# Patient Record
Sex: Female | Born: 1955 | Race: White | Hispanic: No | Marital: Married | Smoking: Never smoker
Health system: Southern US, Community
[De-identification: ages and names within clinical notes are randomized; demographics above are authoritative.]

## PROBLEM LIST (undated history)

## (undated) DIAGNOSIS — I10 Essential (primary) hypertension: Secondary | ICD-10-CM

## (undated) DIAGNOSIS — J45909 Unspecified asthma, uncomplicated: Secondary | ICD-10-CM

---

## 2015-08-05 DIAGNOSIS — G43909 Migraine, unspecified, not intractable, without status migrainosus: Secondary | ICD-10-CM | POA: Insufficient documentation

## 2015-08-05 DIAGNOSIS — E2839 Other primary ovarian failure: Secondary | ICD-10-CM | POA: Insufficient documentation

## 2015-08-05 DIAGNOSIS — L859 Epidermal thickening, unspecified: Secondary | ICD-10-CM | POA: Insufficient documentation

## 2015-08-05 DIAGNOSIS — J45909 Unspecified asthma, uncomplicated: Secondary | ICD-10-CM | POA: Insufficient documentation

## 2015-08-05 DIAGNOSIS — I10 Essential (primary) hypertension: Secondary | ICD-10-CM | POA: Insufficient documentation

## 2015-08-05 DIAGNOSIS — G4700 Insomnia, unspecified: Secondary | ICD-10-CM | POA: Insufficient documentation

## 2015-10-05 DIAGNOSIS — E559 Vitamin D deficiency, unspecified: Secondary | ICD-10-CM | POA: Insufficient documentation

## 2015-10-05 DIAGNOSIS — R7989 Other specified abnormal findings of blood chemistry: Secondary | ICD-10-CM | POA: Insufficient documentation

## 2015-10-05 DIAGNOSIS — F4323 Adjustment disorder with mixed anxiety and depressed mood: Secondary | ICD-10-CM | POA: Insufficient documentation

## 2015-11-08 DIAGNOSIS — R079 Chest pain, unspecified: Secondary | ICD-10-CM | POA: Insufficient documentation

## 2015-11-08 DIAGNOSIS — E785 Hyperlipidemia, unspecified: Secondary | ICD-10-CM | POA: Insufficient documentation

## 2015-12-25 ENCOUNTER — Emergency Department
Admission: EM | Admit: 2015-12-25 | Discharge: 2015-12-25 | Disposition: A | Discharge status: Home / Self Care | Payer: 59 | Source: Home / Self Care | Attending: Family Medicine | Admitting: Family Medicine

## 2015-12-25 DIAGNOSIS — R350 Frequency of micturition: Secondary | ICD-10-CM

## 2015-12-25 DIAGNOSIS — M545 Low back pain, unspecified: Secondary | ICD-10-CM

## 2015-12-25 DIAGNOSIS — R300 Dysuria: Secondary | ICD-10-CM | POA: Diagnosis not present

## 2015-12-25 DIAGNOSIS — R102 Pelvic and perineal pain: Secondary | ICD-10-CM

## 2015-12-25 HISTORY — DX: Essential (primary) hypertension: I10

## 2015-12-25 HISTORY — DX: Unspecified asthma, uncomplicated: J45.909

## 2015-12-25 LAB — POCT URINALYSIS DIP (MANUAL ENTRY)
Bilirubin, UA: NEGATIVE
Blood, UA: NEGATIVE
Glucose, UA: NEGATIVE
Ketones, POC UA: NEGATIVE
Leukocytes, UA: NEGATIVE
Nitrite, UA: NEGATIVE
Protein Ur, POC: NEGATIVE
Spec Grav, UA: >=1.03 (ref 1.005–1.03)
Urobilinogen, UA: 0.2 (ref 0–1)
pH, UA: 5 (ref 5–8)

## 2015-12-25 NOTE — ED Provider Notes (Signed)
CSN: [id number]     Arrival date & time 12/25/15  1145 History   First MD Initiated Contact with Patient 12/25/15 1214     Chief Complaint  Patient presents with  . Urinary Frequency  . Back Pain   (Consider location/radiation/quality/duration/timing/severity/associated sxs/prior Treatment) HPI The pt is a 60yo female presenting to [facility] with c/o bilateral lower back pain, lower abdominal pain, dysuria, and urinary frequency since yesterday.  Pt reports having 2 UTIs within the last 1.5 years and symptoms feel similar.  She typically takes cranberry tablets but thinks her husband accidentally got Azo pills this time. Denies fever, chills, n/v/d. Denies vaginal symptoms.   Past Medical History  Diagnosis Date  . Asthma   . Hypertension    History reviewed. No pertinent past surgical history. Family History  Problem Relation Age of Onset  . Heart failure Mother   . Heart failure Father   . Cancer Father   . Heart failure Brother    Social History  Substance Use Topics  . Smoking status: Never Smoker   . Smokeless tobacco: Never Used  . Alcohol Use: No   OB History    No data available     Review of Systems  Constitutional: Negative for fever and chills.  Gastrointestinal: Positive for abdominal pain (lower abdomen). Negative for nausea, vomiting and diarrhea.  Genitourinary: Positive for dysuria, urgency and frequency. Negative for hematuria, flank pain, decreased urine volume, vaginal bleeding, vaginal discharge, vaginal pain and pelvic pain.  Musculoskeletal: Positive for back pain ( lower back). Negative for myalgias.    Allergies  Review of patient's allergies indicates no known allergies.  Home Medications   Prior to Admission medications   Medication Sig Start Date End Date Taking? Authorizing Provider  hydrochlorothiazide (HYDRODIURIL) 12.5 MG tablet Take 12.5 mg by mouth daily.   Yes Historical Provider, MD  montelukast (SINGULAIR) 10 MG tablet Take 10 mg by mouth  at bedtime.   Yes Historical Provider, MD   Meds Ordered and Administered this Visit  Medications - No data to display  BP 121/80 mmHg  Pulse 68  Temp(Src) 98 F (36.7 C) (Oral)  Ht  (1.651 m)  Wt 198 lb 4 oz (89.926 kg)  BMI 32.99 kg/m2  SpO2 98%  LMP  No data found.   Physical Exam  Constitutional: She is oriented to person, place, and time. She appears well-developed and well-nourished. No distress.  HENT:  Head: Normocephalic and atraumatic.  Mouth/Throat: Oropharynx is clear and moist.  Eyes: Conjunctivae are normal. No scleral icterus.  Neck: Normal range of motion. Neck supple.  Cardiovascular: Normal rate, regular rhythm and normal heart sounds.   Pulmonary/Chest: Effort normal and breath sounds normal. No respiratory distress. She has no wheezes. She has no rales. She exhibits no tenderness.  Abdominal: Soft. She exhibits no distension. There is no tenderness. There is no CVA tenderness.  Musculoskeletal: Normal range of motion.  Neurological: She is alert and oriented to person, place, and time.  Skin: Skin is warm and dry. She is not diaphoretic.  Nursing note and vitals reviewed.   ED Course  Procedures (including critical care time)  Labs Review Labs Reviewed  URINE CULTURE  POCT URINALYSIS DIP (MANUAL ENTRY)    Imaging Review No results found.    MDM   1. Urinary frequency   2. Dysuria   3. Bilateral low back pain without sciatica   4. Pelvic pain in female     Pt c/o urinary symptoms,  lower abdominal cramping and lower back pain. Pt is afebrile. Normal exam.  UA: not c/w UTI, however, due to reports of using possible Azo, will send culture.  Encouraged pt to stay well hydrated. May have acetaminophen and ibuprofen for pain.  F/u with PCP in 4-5 days if not improving, sooner if worsening. She should be notified in 2-3 days of urine culture results, antibiotic will be called in if indicated by culture results. Patient verbalized  understanding and agreement with treatment plan.     Junius Finnerrin O'Malley, PA-C 12/25/15 1311

## 2015-12-25 NOTE — Discharge Instructions (Signed)
Be sure to stay well hydrated with clear liquids and/or Cranberry juice.  You may continue to take Azo to help with bladder spasms/urinary discomfort.  You may take acetaminophen 500mg  every 4-6 hours with a starting dose of 1000mg  and/or ibuprofen 600mg  every 6-8 hours for pain.  Your urine has been sent off to lab for culturing.  This should take 2-3 days for results to come back. If you do not hear back from our office by Tuesday afternoon, please [facility] for an update. We are open Monday-Friday 8am-8pm.

## 2015-12-25 NOTE — ED Notes (Signed)
Lower back pain, urinary frequency x 1 day

## 2015-12-26 LAB — URINE CULTURE
Colony Count: NO GROWTH
Organism ID, Bacteria: NO GROWTH

## 2015-12-27 ENCOUNTER — Telehealth: Payer: Self-pay | Admitting: *Deleted

## 2016-10-07 ENCOUNTER — Emergency Department
Admission: EM | Admit: 2016-10-07 | Discharge: 2016-10-07 | Disposition: A | Discharge status: Home / Self Care | Payer: 59 | Source: Home / Self Care | Attending: Family Medicine | Admitting: Family Medicine

## 2016-10-07 ENCOUNTER — Encounter: Payer: Self-pay | Admitting: Emergency Medicine

## 2016-10-07 DIAGNOSIS — R69 Illness, unspecified: Secondary | ICD-10-CM

## 2016-10-07 DIAGNOSIS — J111 Influenza due to unidentified influenza virus with other respiratory manifestations: Secondary | ICD-10-CM

## 2016-10-07 LAB — POCT URINALYSIS DIP (MANUAL ENTRY)
BILIRUBIN UA: NEGATIVE
Bilirubin, UA: NEGATIVE
Glucose, UA: NEGATIVE
LEUKOCYTES UA: NEGATIVE
Nitrite, UA: NEGATIVE
PROTEIN UA: NEGATIVE
Spec Grav, UA: 1.02 (ref 1.005–1.03)
UROBILINOGEN UA: 0.2 (ref 0–1)
pH, UA: 5.5 (ref 5–8)

## 2016-10-07 MED ORDER — PREDNISONE 20 MG PO TABS: ORAL_TABLET | ORAL | 0 refills | Status: DC

## 2016-10-07 MED ORDER — OSELTAMIVIR PHOSPHATE 75 MG PO CAPS: 75.0000 mg | ORAL_CAPSULE | Freq: Two times a day (BID) | ORAL | 0 refills | Status: DC

## 2016-10-07 MED ORDER — GUAIFENESIN-CODEINE 100-10 MG/5ML PO SOLN: ORAL | 0 refills | Status: DC

## 2016-10-07 NOTE — ED Provider Notes (Addendum)
[facility]    CSN: [id number] Arrival date & time: 10/07/16  1342     History   Chief Complaint Chief Complaint  Patient presents with  . Cough  . Nasal Congestion  . Generalized Body Aches  . Dysuria    HPI Haley Walton is a 61 y.o. female.   Complains of 2 day history flu-like illness including myalgias, headache, chills, fatigue, and cough.  Also has mild nasal congestion and sore throat.  Cough is non-productive and somewhat worse at night.  No pleuritic pain or shortness of breath.  She has had increased low back ache and is concerned that she may have a UTI, although she has no urinary symptoms. She has a history of asthma controlled on Symbicort, Singulair, and albuterol rescue inhaler.  She had pneumonia about 5 years ago.   The history is provided by the patient.    Past Medical History:  Diagnosis Date  . Asthma   . Hypertension     There are no active problems to display for this patient.   History reviewed. No pertinent surgical history.  OB History    No data available       Home Medications    Prior to Admission medications   Medication Sig Start Date End Date Taking? Authorizing Provider  ALPRAZolam Prudy Feeler) 0.25 MG tablet Take 0.25 mg by mouth at bedtime as needed for anxiety.   Yes Historical Provider, MD  guaiFENesin-codeine 100-10 MG/5ML syrup Take 10mL by mouth at bedtime as needed for cough 10/07/16   Lattie Haw, MD  hydrochlorothiazide (HYDRODIURIL) 12.5 MG tablet Take 12.5 mg by mouth daily.    Historical Provider, MD  montelukast (SINGULAIR) 10 MG tablet Take 10 mg by mouth at bedtime.    Historical Provider, MD  oseltamivir (TAMIFLU) 75 MG capsule Take 1 capsule (75 mg total) by mouth every 12 (twelve) hours. 10/07/16   Lattie Haw, MD  predniSONE (DELTASONE) 20 MG tablet Take one tab by mouth twice daily for 5 days, then one daily. Take with food. 10/07/16   Lattie Haw, MD    Family History Family History    Problem Relation Age of Onset  . Heart failure Mother   . Heart failure Father   . Cancer Father   . Heart failure Brother     Social History Social History  Substance Use Topics  . Smoking status: Never Smoker  . Smokeless tobacco: Never Used  . Alcohol use No     Allergies   Patient has no known allergies.   Review of Systems Review of Systems + sore throat + cough No pleuritic pain No wheezing + nasal congestion + post-nasal drainage No sinus pain/pressure No itchy/red eyes No earache No hemoptysis No SOB No fever, + chills No nausea No vomiting No abdominal pain No diarrhea No urinary symptoms No skin rash + fatigue + myalgias + headache Used OTC meds without relief   Physical Exam Triage Vital Signs ED Triage Vitals  Enc Vitals Group     BP 10/07/16 1451 143/74     Pulse Rate 10/07/16 1451 77     Resp 10/07/16 1451 16     Temp 10/07/16 1451 98.2 F (36.8 C)     Temp Source 10/07/16 1451 Oral     SpO2 --      Weight 10/07/16 1454 195 lb (88.5 kg)     Height 10/07/16 1454 5\' 5"  (1.651 m)     Head Circumference --  Peak Flow --      Pain Score 10/07/16 1456 2     Pain Loc --      Pain Edu? --      Excl. in GC? --    No data found.   Updated Vital Signs BP 143/74 (BP Location: Left Arm)   Pulse 77   Temp 98.2 F (36.8 C) (Oral)   Resp 16   Ht 5\' 5"  (1.651 m)   Wt 195 lb (88.5 kg)   BMI 32.45 kg/m   Visual Acuity Right Eye Distance:   Left Eye Distance:   Bilateral Distance:    Right Eye Near:   Left Eye Near:    Bilateral Near:     Physical Exam Nursing notes and Vital Signs reviewed. Appearance:  Patient appears stated age, and in no acute distress Eyes:  Pupils are equal, round, and reactive to light and accomodation.  Extraocular movement is intact.  Conjunctivae are not inflamed  Ears:  Canals normal.  Tympanic membranes normal.  Nose:  Mildly congested turbinates.  No sinus tenderness.   Pharynx:  Normal Neck:   Supple.  Tender enlarged posterior/lateral nodes are palpated bilaterally  Lungs:  Clear to auscultation.  Breath sounds are equal.  Moving air well. Heart:  Regular rate and rhythm without murmurs, rubs, or gallops.  Abdomen:  Nontender without masses or hepatosplenomegaly.  Bowel sounds are present.  No CVA or flank tenderness.  Extremities:  No edema.  Skin:  No rash present.    UC Treatments / Results  Labs (all labs ordered are listed, but only abnormal results are displayed) Labs Reviewed  POCT URINALYSIS DIP (MANUAL ENTRY) - Abnormal; Notable for the following:       Result Value   Color, UA light yellow (*)    Clarity, UA cloudy (*)    Blood, UA trace-lysed (*)    All other components within normal limits  URINE CULTURE    EKG  EKG Interpretation None       Radiology No results found.  Procedures Procedures (including critical care time)  Medications Ordered in UC Medications - No data to display   Initial Impression / Assessment and Plan / UC Course  I have reviewed the triage vital signs and the nursing notes.  Pertinent labs & imaging results that were available during my care of the patient were reviewed by me and considered in my medical decision making (see chart for details).    Note negative urinalysis; no evidence for UTI. Begin Tamiflu, and prednisone burst/taper. Rx for Robitussin AC for night time cough.  Take plain guaifenesin (1200mg  extended release tabs such as Mucinex) twice daily, with plenty of water, for cough and congestion.  May add Pseudoephedrine (30mg , one every 4 to 6 hours) for sinus congestion.  Get adequate rest.   May use Afrin nasal spray (or generic oxymetazoline) twice daily for about 5 days and then discontinue.  Also recommend using saline nasal spray several times daily and saline nasal irrigation (AYR is a common brand).  Use Flonase nasal spray each morning after using Afrin nasal spray and saline nasal irrigation. Try warm  salt water gargles for sore throat.  Continue Singulair.  Resume all inhalers as prescribed. May take Tylenol as needed for fever, headache, sore throat, etc.  Followup with Family Doctor if not improved in about 8 days.    Final Clinical Impressions(s) / UC Diagnoses   Final diagnoses:  Influenza-like illness    New Prescriptions  New Prescriptions   GUAIFENESIN-CODEINE 100-10 MG/5ML SYRUP    Take 10mL by mouth at bedtime as needed for cough   OSELTAMIVIR (TAMIFLU) 75 MG CAPSULE    Take 1 capsule (75 mg total) by mouth every 12 (twelve) hours.   PREDNISONE (DELTASONE) 20 MG TABLET    Take one tab by mouth twice daily for 5 days, then one daily. Take with food.     Lattie Haw, MD 10/13/16 2245    Lattie Haw, MD 10/13/16 [phone]

## 2016-10-07 NOTE — ED Triage Notes (Signed)
Cough and nasal congestion began 2 days ago; caring for her father who has cancer and needs to rule out contagious illnesses. Also has noticed low back pain and has frequent UTIs and would like to be checked.

## 2016-10-07 NOTE — Discharge Instructions (Signed)
Take plain guaifenesin (1200mg  extended release tabs such as Mucinex) twice daily, with plenty of water, for cough and congestion.  May add Pseudoephedrine (30mg , one every 4 to 6 hours) for sinus congestion.  Get adequate rest.   May use Afrin nasal spray (or generic oxymetazoline) twice daily for about 5 days and then discontinue.  Also recommend using saline nasal spray several times daily and saline nasal irrigation (AYR is a common brand).  Use Flonase nasal spray each morning after using Afrin nasal spray and saline nasal irrigation. Try warm salt water gargles for sore throat.  Continue Singulair.  Resume all inhalers as prescribed. May take Tylenol as needed for fever, headache, sore throat, etc.

## 2017-01-17 DIAGNOSIS — D122 Benign neoplasm of ascending colon: Secondary | ICD-10-CM | POA: Insufficient documentation

## 2017-11-21 ENCOUNTER — Emergency Department (INDEPENDENT_AMBULATORY_CARE_PROVIDER_SITE_OTHER): Payer: 59

## 2017-11-21 ENCOUNTER — Emergency Department (INDEPENDENT_AMBULATORY_CARE_PROVIDER_SITE_OTHER)
Admission: EM | Admit: 2017-11-21 | Discharge: 2017-11-21 | Disposition: A | Discharge status: Home / Self Care | Payer: 59 | Source: Home / Self Care | Attending: Emergency Medicine | Admitting: Emergency Medicine

## 2017-11-21 ENCOUNTER — Encounter: Payer: Self-pay | Admitting: *Deleted

## 2017-11-21 ENCOUNTER — Other Ambulatory Visit: Payer: Self-pay

## 2017-11-21 DIAGNOSIS — X501XXA Overexertion from prolonged static or awkward postures, initial encounter: Secondary | ICD-10-CM

## 2017-11-21 DIAGNOSIS — S93402A Sprain of unspecified ligament of left ankle, initial encounter: Secondary | ICD-10-CM | POA: Diagnosis not present

## 2017-11-21 DIAGNOSIS — S93602A Unspecified sprain of left foot, initial encounter: Secondary | ICD-10-CM | POA: Diagnosis not present

## 2017-11-21 DIAGNOSIS — IMO0001 Reserved for inherently not codable concepts without codable children: Secondary | ICD-10-CM

## 2017-11-21 DIAGNOSIS — S99912A Unspecified injury of left ankle, initial encounter: Secondary | ICD-10-CM

## 2017-11-21 MED ORDER — HYDROCODONE-ACETAMINOPHEN 5-325 MG PO TABS: 1.0000 | ORAL_TABLET | ORAL | 0 refills | Status: DC | PRN

## 2017-11-21 NOTE — ED Provider Notes (Signed)
[name]    CSN: [id number] Arrival date & time: 11/21/17  1810     History   Chief Complaint Chief Complaint  Patient presents with  . Foot Pain    HPI Haley Walton is a 62 y.o. female.   HPI Complains of moderate to severe left foot and lateral left ankle pain after inversion injury this morning at 9 AM.  The pain is sharp and dull without radiation.  No associated paresthesias or weakness or numbness.  Has tried resting and elevating it without help.  She is unable to weight-bear without severe pain.  Denies prior foot or ankle problems. Past Medical History:  Diagnosis Date  . Asthma   . Hypertension     There are no active problems to display for this patient.   History reviewed. No pertinent surgical history.  OB History    No data available       Home Medications    Prior to Admission medications   Medication Sig Start Date End Date Taking? Authorizing Provider  hydrochlorothiazide (HYDRODIURIL) 12.5 MG tablet Take 12.5 mg by mouth daily.   Yes [provider]  montelukast (SINGULAIR) 10 MG tablet Take 10 mg by mouth at bedtime.   Yes [provider]  ALPRAZolam (XANAX) 0.25 MG tablet Take 0.25 mg by mouth at bedtime as needed for anxiety.    [provider]  HYDROcodone-acetaminophen (NORCO/VICODIN) 5-325 MG tablet Take 1-2 tablets by mouth every 4 (four) hours as needed for severe pain. Take with food. 11/21/17   Lajean Manes, MD    Family History Family History  Problem Relation Age of Onset  . Heart failure Mother   . Heart failure Father   . Cancer Father   . Heart failure Brother     Social History Social History   Tobacco Use  . Smoking status: Never Smoker  . Smokeless tobacco: Never Used  Substance Use Topics  . Alcohol use: No  . Drug use: No     Allergies   Patient has no known allergies.   Review of Systems Review of Systems  All other systems reviewed and are negative.    Physical  Exam Triage Vital Signs ED Triage Vitals [11/21/17 1828]  Enc Vitals Group     BP 127/79     Pulse Rate 85     Resp 16     Temp 98 F (36.7 C)     Temp Source Oral     SpO2 98 %     Weight 200 lb (90.7 kg)     Height 5' 5.5" (1.664 m)     Head Circumference      Peak Flow      Pain Score 7     Pain Loc      Pain Edu?      Excl. in GC?    No data found.  Updated Vital Signs BP 127/79 (BP Location: Right Arm)   Pulse 85   Temp 98 F (36.7 C) (Oral)   Resp 16   Ht 5' 5.5" (1.664 m)   Wt 200 lb (90.7 kg)   SpO2 98%   BMI 32.78 kg/m    Physical Exam  Constitutional: She is oriented to person, place, and time. She appears well-developed and well-nourished. No distress.  No acute cardiorespiratory distress, but she is very uncomfortable from left foot and ankle pain.  She avoids weightbearing on left lower extremity.  HENT:  Head: Normocephalic and atraumatic.  Eyes:  Pupils are equal, round, and reactive to light. No scleral icterus.  Neck: Normal range of motion. Neck supple.  Cardiovascular: Normal rate and regular rhythm.  Pulmonary/Chest: Effort normal.  Abdominal: She exhibits no distension.  Musculoskeletal:       Left ankle: She exhibits decreased range of motion, swelling and ecchymosis. She exhibits no laceration and normal pulse. Tenderness. Lateral malleolus and AITFL tenderness found. No head of 5th metatarsal and no proximal fibula tenderness found. Achilles tendon normal.       Left foot: There is decreased range of motion, bony tenderness and swelling. There is normal capillary refill and no laceration.  No definite instability.  Neurovascular left lower extremity intact.  Neurological: She is alert and oriented to person, place, and time. No cranial nerve deficit.  Skin: Skin is warm and dry.  Psychiatric: She has a normal mood and affect. Her behavior is normal.  Vitals reviewed.    UC Treatments / Results  Labs (all labs ordered are listed, but  only abnormal results are displayed) Labs Reviewed - No data to display  EKG  EKG Interpretation None      Radiology  LEFT ANKLE COMPLETE - 3+ VIEW COMPARISON:  None. FINDINGS: No evidence of acute fracture. Ankle mortise intact with well-preserved joint space. Well-preserved bone mineral density. No intrinsic osseous abnormalities. No visible joint effusion.  IMPRESSION: Normal examination.  Electronically Signed   By: Hulan Saas M.D.   On: 11/21/2017 18:53   LEFT FOOT - COMPLETE 3+ VIEW COMPARISON:  None. FINDINGS: No evidence of acute fracture or dislocation. Mild hallux valgus. Well-preserved joint spaces. Well-preserved bone mineral density. Accessory ossicles adjacent to the cuboid bone and adjacent to the navicular bone. IMPRESSION: 1. No acute osseous abnormality. 2. Mild hallux valgus.  Electronically Signed   By: Hulan Saas M.D.   On: 11/21/2017 18:52  Procedures Procedures (including critical care time)  Medications Ordered in UC Medications - No data to display   Initial Impression / Assessment and Plan / UC Course  I have reviewed the triage vital signs and the nursing notes.  Pertinent labs & imaging results that were available during my care of the patient were reviewed by me and considered in my medical decision making (see chart for details).       Final Clinical Impressions(s) / UC Diagnoses   Final diagnoses:  Grade 2 ankle sprain, left, initial encounter  FSPR (foot sprain), left, initial encounter  Discussed diagnosis at length with patient.  ED Discharge Orders        Ordered    HYDROcodone-acetaminophen (NORCO/VICODIN) 5-325 MG tablet  Every 4 hours PRN     11/21/17 1926    She agrees with the following plans: An After Visit Summary was printed and given to the patient. Reviewed at length.  Questions invited and answered.  X-ray of left foot and left ankle show no fracture Ace bandage left foot and  ankle, applied. Cam walker//boot Use crutches to walk. Tonight, apply ice, and elevate left foot Follow-up with orthopedist if no better one week or sooner if worse or new symptoms. Please read attached instructions on foot and ankle sprains. Ibuprofen as needed for moderate pain Vicodin, #12 prescribed, no refills.    as needed for acute severe pain.  Controlled Substance Prescriptions [place] Controlled Substance Registry consulted? Yes, I have consulted the  Controlled Substances Registry for this patient, and feel the risk/benefit ratio today is favorable for proceeding with this prescription for a controlled  substance.   Lajean ManesMassey, David, MD 11/25/17 1416

## 2017-11-21 NOTE — Discharge Instructions (Signed)
An After Visit Summary was printed and given to the patient.  X-ray of left foot and left ankle show no fracture Ace bandage left foot and ankle. Cam walker//boot Use crutches to walk. Tonight, apply ice, and elevate left foot Follow-up with orthopedist if no better one week or sooner if worse or new symptoms. Please read attached instructions on foot and ankle sprains.

## 2017-11-21 NOTE — ED Triage Notes (Signed)
Pt c/o LT foot and ankle pain after she reports that she "rolled" her LT foot this morning. She took IBF 400mg  at 1730.

## 2019-05-29 DIAGNOSIS — K219 Gastro-esophageal reflux disease without esophagitis: Secondary | ICD-10-CM | POA: Insufficient documentation

## 2019-09-18 ENCOUNTER — Other Ambulatory Visit: Payer: Self-pay

## 2019-09-18 ENCOUNTER — Emergency Department (INDEPENDENT_AMBULATORY_CARE_PROVIDER_SITE_OTHER)
Admission: EM | Admit: 2019-09-18 | Discharge: 2019-09-18 | Disposition: A | Discharge status: Home / Self Care | Payer: 59 | Source: Home / Self Care | Attending: Family Medicine | Admitting: Family Medicine

## 2019-09-18 DIAGNOSIS — J4531 Mild persistent asthma with (acute) exacerbation: Secondary | ICD-10-CM

## 2019-09-18 LAB — POCT CBC W AUTO DIFF (K'VILLE URGENT CARE)

## 2019-09-18 MED ORDER — GUAIFENESIN-CODEINE 100-10 MG/5ML PO SOLN: ORAL | 0 refills | Status: AC

## 2019-09-18 MED ORDER — PREDNISONE 20 MG PO TABS: ORAL_TABLET | ORAL | 0 refills | Status: DC

## 2019-09-18 MED ORDER — METHYLPREDNISOLONE SODIUM SUCC 125 MG IJ SOLR
80.0000 mg | Freq: Once | INTRAMUSCULAR | Status: AC
  Administered 2019-09-18: 80 mg via INTRAMUSCULAR

## 2019-09-18 MED ORDER — DOXYCYCLINE HYCLATE 100 MG PO CAPS: 100.0000 mg | ORAL_CAPSULE | Freq: Two times a day (BID) | ORAL | 0 refills | Status: DC

## 2019-09-18 NOTE — Discharge Instructions (Addendum)
Begin prednisone Saturday, 09/19/19 Take plain guaifenesin (1200mg  extended release tabs such as Mucinex) twice daily, with plenty of water, for cough and congestion.   May use Afrin nasal spray (or generic oxymetazoline) each morning for about 5 days and then discontinue.  Also recommend using saline nasal spray several times daily and saline nasal irrigation (AYR is a common brand).  Use Flonase nasal spray each morning after using Afrin nasal spray and saline nasal irrigation. Try warm salt water gargles for sore throat.  Stop all antihistamines (Zyrtec etc) for now, and other non-prescription cough/cold preparations. Continue Singulair and inhalers as prescribed  Talk to your doctor about increasing your Vitamin D level

## 2019-09-18 NOTE — ED Provider Notes (Signed)
[facility]    CSN: [id number] Arrival date & time: 09/18/19  0951      History   Chief Complaint Chief Complaint  Patient presents with  . Cough    URI/ Bronchitis    HPI Haley Walton is a 64 y.o. female.   Patient complains of onset of sinus congestion five days ago followed by mild cough and low grade fever.  She had mild wheezing yesterday. She has mild persistent asthma with a history of COVID19 infection in September, 2020.  On a follow-up visit with her PCP, chest imaging showed a small left upper lobe nodule.  She followed up with a pulmonologist on 08/06/19 who felt that the nodule represented reactive changes to COVID19 pneumonia.  She is schedule for repeat imaging with pulmonologist Dr. Fara Chute on 11/06/19. She is presently using QVAR inhaler, albuterol inhaler, albuterol by nebulizer BID, and taking Singulair.  The history is provided by the patient and a parent.    Past Medical History:  Diagnosis Date  . Asthma   . Hypertension     There are no problems to display for this patient.   No past surgical history on file.  OB History   No obstetric history on file.      Home Medications    Prior to Admission medications   Medication Sig Start Date End Date Taking? Authorizing Provider  ALPRAZolam Duanne Moron) 0.25 MG tablet Take 0.25 mg by mouth at bedtime as needed for anxiety.    [provider]  doxycycline (VIBRAMYCIN) 100 MG capsule Take 1 capsule (100 mg total) by mouth 2 (two) times daily. Take with food. 09/18/19   Kandra Nicolas, MD  guaiFENesin-codeine 100-10 MG/5ML syrup Take 2mL by mouth at bedtime as needed for cough. 09/18/19   Kandra Nicolas, MD  hydrochlorothiazide (HYDRODIURIL) 12.5 MG tablet Take 12.5 mg by mouth daily.    [provider]  HYDROcodone-acetaminophen (NORCO/VICODIN) 5-325 MG tablet Take 1-2 tablets by mouth every 4 (four) hours as needed for severe pain. Take with food. 11/21/17   Jacqulyn Cane, MD    montelukast (SINGULAIR) 10 MG tablet Take 10 mg by mouth at bedtime.    [provider]  predniSONE (DELTASONE) 20 MG tablet Take one tab by mouth twice daily for 4 days, then one daily for 3 days. Take with food. 09/18/19   Kandra Nicolas, MD    Family History Family History  Problem Relation Age of Onset  . Heart failure Mother   . Heart failure Father   . Cancer Father   . Heart failure Brother     Social History Social History   Tobacco Use  . Smoking status: Never Smoker  . Smokeless tobacco: Never Used  Substance Use Topics  . Alcohol use: No  . Drug use: No     Allergies   Patient has no known allergies.   Review of Systems Review of Systems ? sore throat + cough No pleuritic pain, but feels tight in anterior chest + wheezing + nasal congestion + post-nasal drainage No sinus pain/pressure No itchy/red eyes No earache No hemoptysis + SOB ? fever, + chills No nausea No vomiting No abdominal pain No diarrhea No urinary symptoms No skin rash + fatigue ? myalgias No headache Used OTC meds without relief   Physical Exam Triage Vital Signs ED Triage Vitals  Enc Vitals Group     BP 09/18/19 1018 128/83     Pulse Rate 09/18/19 1018 94  Resp --      Temp 09/18/19 1018 98.2 F (36.8 C)     Temp Source 09/18/19 1018 Oral     SpO2 09/18/19 1018 98 %     Weight 09/18/19 1021 200 lb 12.8 oz (91.1 kg)     Height 09/18/19 1021 5\' 5"  (1.651 m)     Head Circumference --      Peak Flow --      Pain Score 09/18/19 1021 0     Pain Loc --      Pain Edu? --      Excl. in GC? --    No data found.  Updated Vital Signs BP 128/83 (BP Location: Left Arm)   Pulse 94   Temp 98.2 F (36.8 C) (Oral)   Ht 5\' 5"  (1.651 m)   Wt 91.1 kg   SpO2 98%   BMI 33.41 kg/m   Visual Acuity Right Eye Distance:   Left Eye Distance:   Bilateral Distance:    Right Eye Near:   Left Eye Near:    Bilateral Near:     Physical Exam Nursing notes and Vital  Signs reviewed. Appearance:  Patient appears stated age, and in no acute distress Eyes:  Pupils are equal, round, and reactive to light and accomodation.  Extraocular movement is intact.  Conjunctivae are not inflamed  Ears:  Canals normal.  Tympanic membranes normal.  Nose:  Mildly congested turbinates.  No sinus tenderness.  Pharynx:  Normal Neck:  Supple.  Tender shotty left lateral nodes  Lungs:  Clear to auscultation.  Breath sounds are equal.  Moving air well. Heart:  Regular rate and rhythm without murmurs, rubs, or gallops.  Abdomen:  Nontender without masses or hepatosplenomegaly.  Bowel sounds are present.  No CVA or flank tenderness.  Extremities:  No edema.  Skin:  No rash present.   UC Treatments / Results  Labs (all labs ordered are listed, but only abnormal results are displayed) Labs Reviewed  POCT CBC W AUTO DIFF ([facility]):  WBC 6.1; LY 18.2; MO 9.8; GR 72.0; Hgb 14.9; Platelets     EKG   Radiology No results found.  Procedures Procedures (including critical care time)  Medications Ordered in UC Medications  methylPREDNISolone sodium succinate (SOLU-MEDROL) 125 mg/2 mL injection 80 mg (has no administration in time range)    Initial Impression / Assessment and Plan / UC Course  I have reviewed the triage vital signs and the nursing notes.  Pertinent labs & imaging results that were available during my care of the patient were reviewed by me and considered in my medical decision making (see chart for details).    Suspect viral URI.  Normal WBC (6.1) reassuring Administered Solumedrol 80mg  IM, then begin prednisone burst/taper. Begin empiric doxycycline. Rx for Robitussin AC for night time cough.  Controlled Substance Prescriptions I have consulted the [place] Controlled Substances Registry for this patient, and feel the risk/benefit ratio today is favorable for proceeding with this prescription for a controlled substance.   Followup with Family  Doctor if not improved in six days Followup with pulmonologist as scheduled   Final Clinical Impressions(s) / UC Diagnoses   Final diagnoses:  Mild persistent asthma with exacerbation     Discharge Instructions     Begin prednisone Saturday, 09/19/19 Take plain guaifenesin (1200mg  extended release tabs such as Mucinex) twice daily, with plenty of water, for cough and congestion.   May use Afrin nasal spray (or generic oxymetazoline) each  morning for about 5 days and then discontinue.  Also recommend using saline nasal spray several times daily and saline nasal irrigation (AYR is a common brand).  Use Flonase nasal spray each morning after using Afrin nasal spray and saline nasal irrigation. Try warm salt water gargles for sore throat.  Stop all antihistamines (Zyrtec etc) for now, and other non-prescription cough/cold preparations. Continue Singulair and inhalers as prescribed  Talk to your doctor about increasing your Vitamin D level      ED Prescriptions    Medication Sig Dispense Auth. Provider   predniSONE (DELTASONE) 20 MG tablet Take one tab by mouth twice daily for 4 days, then one daily for 3 days. Take with food. 11 tablet Lattie Haw, MD   doxycycline (VIBRAMYCIN) 100 MG capsule Take 1 capsule (100 mg total) by mouth 2 (two) times daily. Take with food. 14 capsule Lattie Haw, MD   guaiFENesin-codeine 100-10 MG/5ML syrup Take 34mL by mouth at bedtime as needed for cough. 50 mL Lattie Haw, MD        Lattie Haw, MD 09/23/19 [phone]

## 2019-09-18 NOTE — ED Triage Notes (Signed)
Pt here with lingering post COVID x2 symptoms. Pos covid in September and December 12/9. Hx Asthma- worsening cough with clear phlegm, SOB intermit with cough. cxr showed spot on lung. Nebs BIB, Qvar, Proventil and OTC Robitussin, Zyrtec and Mucinex. sats 98% RA.

## 2020-03-05 ENCOUNTER — Emergency Department
Admission: EM | Admit: 2020-03-05 | Discharge: 2020-03-05 | Disposition: A | Discharge status: Home / Self Care | Payer: 59 | Source: Home / Self Care | Attending: Family Medicine | Admitting: Family Medicine

## 2020-03-05 ENCOUNTER — Emergency Department
Admission: RE | Admit: 2020-03-05 | Discharge: 2020-03-05 | Disposition: A | Discharge status: Home / Self Care | Payer: 59 | Source: Ambulatory Visit

## 2020-03-05 ENCOUNTER — Emergency Department (INDEPENDENT_AMBULATORY_CARE_PROVIDER_SITE_OTHER): Payer: 59

## 2020-03-05 ENCOUNTER — Other Ambulatory Visit: Payer: Self-pay

## 2020-03-05 DIAGNOSIS — R0989 Other specified symptoms and signs involving the circulatory and respiratory systems: Secondary | ICD-10-CM | POA: Diagnosis not present

## 2020-03-05 DIAGNOSIS — R6883 Chills (without fever): Secondary | ICD-10-CM

## 2020-03-05 DIAGNOSIS — R05 Cough: Secondary | ICD-10-CM | POA: Diagnosis not present

## 2020-03-05 DIAGNOSIS — R053 Chronic cough: Secondary | ICD-10-CM

## 2020-03-05 MED ORDER — ESOMEPRAZOLE MAGNESIUM 40 MG PO CPDR: DELAYED_RELEASE_CAPSULE | ORAL | 0 refills | Status: AC

## 2020-03-05 MED ORDER — DOXYCYCLINE HYCLATE 100 MG PO CAPS: 100.0000 mg | ORAL_CAPSULE | Freq: Two times a day (BID) | ORAL | 0 refills | Status: DC

## 2020-03-05 NOTE — ED Provider Notes (Signed)
[name]    CSN: [id number] Arrival date & time: 03/05/20  1513      History   Chief Complaint Chief Complaint  Patient presents with  . Appointment    3pm  . Cough    HPI Haley Walton is a 64 y.o. female.   Patient has had a persistent cough for over 11 days, with chills/sweats initially.  She was prescribed a Z-pak and prednisone taper one week ago without much change in her cough.  She uses a Sybicort inhaler BID, albuterol by nebulizer 1-2 times daily and also has an albuterol MDI. She had been taking Zyrtec but discontinued 3 days ago. She states that her cough is worse upon awakening, when she has significant mucous in her throat.  She presently does not feel ill. She has had two COVID19 vaccinations.   The history is provided by the patient.    Past Medical History:  Diagnosis Date  . Asthma   . Hypertension     There are no problems to display for this patient.   History reviewed. No pertinent surgical history.  OB History   No obstetric history on file.      Home Medications    Prior to Admission medications   Medication Sig Start Date End Date Taking? Authorizing Provider  ALPRAZolam Duanne Moron) 0.25 MG tablet Take 0.25 mg by mouth at bedtime as needed for anxiety.    [provider]  doxycycline (VIBRAMYCIN) 100 MG capsule Take 1 capsule (100 mg total) by mouth 2 (two) times daily. Take with food. 03/05/20   Kandra Nicolas, MD  esomeprazole (NEXIUM) 40 MG capsule Take one cap PO once daily, 20 minutes AC 03/05/20   Kandra Nicolas, MD  guaiFENesin (MUCINEX) 600 MG 12 hr tablet Take by mouth.    [provider]  guaiFENesin-codeine 100-10 MG/5ML syrup Take 52mL by mouth at bedtime as needed for cough. 09/18/19   Kandra Nicolas, MD  hydrochlorothiazide (HYDRODIURIL) 12.5 MG tablet Take 12.5 mg by mouth daily.    [provider]  HYDROcodone-acetaminophen (NORCO/VICODIN) 5-325 MG tablet Take 1-2 tablets by mouth  every 4 (four) hours as needed for severe pain. Take with food. 11/21/17   Jacqulyn Cane, MD  montelukast (SINGULAIR) 10 MG tablet Take 10 mg by mouth at bedtime.    [provider]  predniSONE (DELTASONE) 20 MG tablet Take one tab by mouth twice daily for 4 days, then one daily for 3 days. Take with food. 09/18/19   Kandra Nicolas, MD  SYMBICORT 160-4.5 MCG/ACT inhaler SMARTSIG:2 Puff(s) By Mouth Twice Daily 12/24/19   [provider]    Family History Family History  Problem Relation Age of Onset  . Heart failure Mother   . Heart failure Father   . Cancer Father   . Heart failure Brother     Social History Social History   Tobacco Use  . Smoking status: Never Smoker  . Smokeless tobacco: Never Used  Vaping Use  . Vaping Use: Never used  Substance Use Topics  . Alcohol use: No  . Drug use: No     Allergies   Patient has no known allergies.   Review of Systems Review of Systems  No sore throat + cough No pleuritic pain but feels tight in chest No wheezing + nasal congestion + post-nasal drainage No sinus pain/pressure No itchy/red eyes No earache No hemoptysis + SOB No fever/chills No nausea No vomiting No abdominal pain No diarrhea  No urinary symptoms No skin rash + fatigue No myalgias No headache Used OTC meds (Zyrtec, Mucinex) without relief    Physical Exam Triage Vital Signs ED Triage Vitals  Enc Vitals Group     BP 03/05/20 1517 135/84     Pulse Rate 03/05/20 1517 79     Resp 03/05/20 1517 18     Temp 03/05/20 1517 98.8 F (37.1 C)     Temp Source 03/05/20 1517 Oral     SpO2 03/05/20 1517 96 %     Weight --      Height --      Head Circumference --      Peak Flow --      Pain Score 03/05/20 1520 0     Pain Loc --      Pain Edu? --      Excl. in GC? --    No data found.  Updated Vital Signs BP 135/84 (BP Location: Right Arm)   Pulse 79   Temp 98.8 F (37.1 C) (Oral)   Resp 18   SpO2 96%   Visual Acuity Right  Eye Distance:   Left Eye Distance:   Bilateral Distance:    Right Eye Near:   Left Eye Near:    Bilateral Near:     Physical Exam Nursing notes and Vital Signs reviewed. Appearance:  Patient appears stated age, and in no acute distress Eyes:  Pupils are equal, round, and reactive to light and accomodation.  Extraocular movement is intact.  Conjunctivae are not inflamed  Ears:  Canals normal.  Tympanic membranes normal.  Nose:  Mildly congested turbinates.  No sinus tenderness.  Pharynx:  Normal Neck:  Supple. No adenopathy. Lungs:  Clear to auscultation.  Breath sounds are equal.  Moving air well. Heart:  Regular rate and rhythm without murmurs, rubs, or gallops.  Abdomen:  Nontender without masses or hepatosplenomegaly.  Bowel sounds are present.  No CVA or flank tenderness.  Extremities:  No edema.  Skin:  No rash present.   UC Treatments / Results  Labs (all labs ordered are listed, but only abnormal results are displayed) Labs Reviewed - No data to display  EKG   Radiology DG Chest 2 View  Result Date: 03/05/2020 CLINICAL DATA:  Cough 1-2 weeks with congestion and chills. EXAM: CHEST - 2 VIEW COMPARISON:  06/22/2019 FINDINGS: Lungs are adequately inflated without airspace consolidation or effusion. Minimal chronic stable density over the anterior lingula. Focal density over the left suprahilar region unchanged. Cardiomediastinal silhouette and remainder of the exam is unchanged. IMPRESSION: No active cardiopulmonary disease. Electronically Signed   By: Elberta Fortis M.D.   On: 03/05/2020 16:05    Procedures Procedures (including critical care time)  Medications Ordered in UC Medications - No data to display  Initial Impression / Assessment and Plan / UC Course  I have reviewed the triage vital signs and the nursing notes.  Pertinent labs & imaging results that were available during my care of the patient were reviewed by me and considered in my medical decision making  (see chart for details).    Negative chest x-ray reassuring (chronic changes stable). ?bronchitis Advised to taper off prednisone. Begin doxycycline for one week. Suspect component of GERD contributing to her cough; add Nexium each evening.  Followup with family doctor Followup with pulmonologist if not improved about 10 days.   Final Clinical Impressions(s) / UC Diagnoses   Final diagnoses:  Persistent cough     Discharge Instructions  Take plain guaifenesin (1200mg  extended release tabs such as Mucinex) twice daily, with plenty of water, for cough and congestion.   Get adequate rest.   Continue Tessalon Perles (total 200mg ) at bedtime.  May also take Delsym Cough Suppressant at bedtime for nighttime cough.  Try warm salt water gargles for sore throat.  Stop all antihistamines (Zyrtec) for now, and other non-prescription cough/cold preparations. Continue Symbicort and albuterol inhalers as prescribed.  Continue albuterol by nebulizer as needed.    ED Prescriptions    Medication Sig Dispense Auth. Provider   doxycycline (VIBRAMYCIN) 100 MG capsule Take 1 capsule (100 mg total) by mouth 2 (two) times daily. Take with food. 14 capsule , MD   esomeprazole (NEXIUM) 40 MG capsule Take one cap PO once daily, 20 minutes AC 30 capsule , MD        Lattie Haw, MD 03/07/20 2102

## 2020-03-05 NOTE — ED Triage Notes (Signed)
Pt c/o coughing x 11 days. States she has had covid 2x. Currently rx'd prednisone. (20mg ) Seen at [facility] last Sat and was rx'd a zpak. Coughing up thick mucous. Taking mucinex prn.

## 2020-03-05 NOTE — Discharge Instructions (Addendum)
Take plain guaifenesin (1200mg  extended release tabs such as Mucinex) twice daily, with plenty of water, for cough and congestion.   Get adequate rest.   Continue Tessalon Perles (total 200mg ) at bedtime.  May also take Delsym Cough Suppressant at bedtime for nighttime cough.  Try warm salt water gargles for sore throat.  Stop all antihistamines (Zyrtec) for now, and other non-prescription cough/cold preparations. Continue Symbicort and albuterol inhalers as prescribed.  Continue albuterol by nebulizer as needed.

## 2020-06-09 DIAGNOSIS — J4550 Severe persistent asthma, uncomplicated: Secondary | ICD-10-CM | POA: Insufficient documentation

## 2020-06-26 ENCOUNTER — Other Ambulatory Visit: Payer: Self-pay

## 2020-06-26 ENCOUNTER — Emergency Department
Admission: RE | Admit: 2020-06-26 | Discharge: 2020-06-26 | Disposition: A | Discharge status: Home / Self Care | Payer: 59 | Source: Ambulatory Visit | Attending: Family Medicine | Admitting: Family Medicine

## 2020-06-26 VITALS — BP 119/80 | HR 82 | Temp 98.9°F | Resp 18 | Ht 65.0 in | Wt 200.0 lb

## 2020-06-26 DIAGNOSIS — R6883 Chills (without fever): Secondary | ICD-10-CM

## 2020-06-26 DIAGNOSIS — R300 Dysuria: Secondary | ICD-10-CM

## 2020-06-26 DIAGNOSIS — R1032 Left lower quadrant pain: Secondary | ICD-10-CM

## 2020-06-26 LAB — POCT CBC W AUTO DIFF (K'VILLE URGENT CARE)

## 2020-06-26 LAB — POCT URINALYSIS DIP (MANUAL ENTRY)
Bilirubin, UA: NEGATIVE
Blood, UA: NEGATIVE
Glucose, UA: NEGATIVE mg/dL
Ketones, POC UA: NEGATIVE mg/dL
Leukocytes, UA: NEGATIVE
Nitrite, UA: NEGATIVE
Protein Ur, POC: NEGATIVE mg/dL
Spec Grav, UA: >=1.03 — AB (ref 1.010–1.025)
Urobilinogen, UA: 0.2 E.U./dL
pH, UA: 6 (ref 5.0–8.0)

## 2020-06-26 MED ORDER — AMOXICILLIN-POT CLAVULANATE 875-125 MG PO TABS: ORAL_TABLET | ORAL | 0 refills | Status: DC

## 2020-06-26 NOTE — ED Triage Notes (Signed)
Patient here for burning in bladder for about 2 weeks; has been on prednisone for bronchitis that long; not antibiotics; took diflucan one week ago on chance this is vaginitis but did not relieve symptoms; yesterday began having chills and sweating. Had first of covid vaccinations then became ill and did not get second dose.

## 2020-06-26 NOTE — ED Provider Notes (Signed)
[name]    CSN: [id number] Arrival date & time: 06/26/20  0856      History   Chief Complaint Chief Complaint  Patient presents with  . Dysuria    HPI Haley Walton is a 64 y.o. female.   Patient developed dysuria described as a burning sensation 10 days ago.  The dysuria has been constant but she denies urgency, frequency, and nocturia.  One week ago she tried a Diflucan tablet which was not helpful.  During the past several days she has developed vague sporadic left lower quadrant abdominal pain that sometimes radiates laterally.  During the past two days she has developed fatigue, chills/sweats, and loose stools.  She denies nausea/vomiting. She finished a 10 day steroid taper five days ago that had been prescribed by her pulmonologist.  The history is provided by the patient.    Past Medical History:  Diagnosis Date  . Asthma   . Hypertension     There are no problems to display for this patient.   History reviewed. No pertinent surgical history.  OB History   No obstetric history on file.      Home Medications    Prior to Admission medications   Medication Sig Start Date End Date Taking? Authorizing Provider  tiotropium (SPIRIVA) 18 MCG inhalation capsule Place 18 mcg into inhaler and inhale daily.   Yes [provider]  ALPRAZolam (XANAX) 0.25 MG tablet Take 0.25 mg by mouth at bedtime as needed for anxiety.    [provider]  amoxicillin-clavulanate (AUGMENTIN) 875-125 MG tablet Take one tab PO Q8hr 06/26/20   Lattie Haw, MD  esomeprazole (NEXIUM) 40 MG capsule Take one cap PO once daily, 20 minutes AC 03/05/20   Lattie Haw, MD  guaiFENesin (MUCINEX) 600 MG 12 hr tablet Take by mouth.    [provider]  guaiFENesin-codeine 100-10 MG/5ML syrup Take 24mL by mouth at bedtime as needed for cough. 09/18/19   Lattie Haw, MD  hydrochlorothiazide (HYDRODIURIL) 12.5 MG tablet Take 12.5 mg by mouth daily.     [provider]  HYDROcodone-acetaminophen (NORCO/VICODIN) 5-325 MG tablet Take 1-2 tablets by mouth every 4 (four) hours as needed for severe pain. Take with food. 11/21/17   Lajean Manes, MD  montelukast (SINGULAIR) 10 MG tablet Take 10 mg by mouth at bedtime.    [provider]  predniSONE (DELTASONE) 20 MG tablet Take one tab by mouth twice daily for 4 days, then one daily for 3 days. Take with food. 09/18/19   Lattie Haw, MD  SYMBICORT 160-4.5 MCG/ACT inhaler SMARTSIG:2 Puff(s) By Mouth Twice Daily 12/24/19   [provider]    Family History Family History  Problem Relation Age of Onset  . Heart failure Mother   . Heart failure Father   . Cancer Father   . Heart failure Brother     Social History Social History   Tobacco Use  . Smoking status: Never Smoker  . Smokeless tobacco: Never Used  Vaping Use  . Vaping Use: Never used  Substance Use Topics  . Alcohol use: No  . Drug use: No     Allergies   Patient has no known allergies.   Review of Systems Review of Systems  Constitutional: Positive for activity change, chills, diaphoresis and fatigue. Negative for appetite change and fever.  HENT: Negative.   Eyes: Negative.   Respiratory: Negative.   Cardiovascular: Negative.   Gastrointestinal: Positive for abdominal pain and diarrhea.  Negative for abdominal distention, blood in stool, constipation, nausea and vomiting.  Genitourinary: Positive for dysuria. Negative for flank pain, frequency, hematuria, pelvic pain, urgency and vaginal discharge.  Musculoskeletal: Negative.   Skin: Negative for rash.  Neurological: Negative.   Hematological: Negative for adenopathy.     Physical Exam Triage Vital Signs ED Triage Vitals  Enc Vitals Group     BP 06/26/20 0918 119/80     Pulse Rate 06/26/20 0918 82     Resp 06/26/20 0918 18     Temp 06/26/20 0918 98.9 F (37.2 C)     Temp Source 06/26/20 0918 Oral     SpO2 06/26/20 0918 96 %      Weight 06/26/20 0920 200 lb (90.7 kg)     Height 06/26/20 0920 5\' 5"  (1.651 m)     Head Circumference --      Peak Flow --      Pain Score 06/26/20 0919 6     Pain Loc --      Pain Edu? --      Excl. in GC? --    No data found.  Updated Vital Signs BP 119/80 (BP Location: Right Arm)   Pulse 82   Temp 98.9 F (37.2 C) (Oral)   Resp 18   Ht 5\' 5"  (1.651 m)   Wt 90.7 kg   SpO2 96%   BMI 33.28 kg/m   Visual Acuity Right Eye Distance:   Left Eye Distance:   Bilateral Distance:    Right Eye Near:   Left Eye Near:    Bilateral Near:     Physical Exam Vitals and nursing note reviewed.  Constitutional:      General: She is not in acute distress. HENT:     Head: Normocephalic.     Right Ear: External ear normal.     Left Ear: External ear normal.     Nose: Nose normal.     Mouth/Throat:     Mouth: Mucous membranes are moist.     Pharynx: Oropharynx is clear.  Eyes:     Conjunctiva/sclera: Conjunctivae normal.     Pupils: Pupils are equal, round, and reactive to light.  Cardiovascular:     Rate and Rhythm: Normal rate and regular rhythm.     Heart sounds: Normal heart sounds.  Pulmonary:     Breath sounds: Normal breath sounds.  Abdominal:     General: Bowel sounds are normal. There is no distension.     Palpations: Abdomen is soft. There is no hepatomegaly, splenomegaly or mass.     Tenderness: There is abdominal tenderness in the left lower quadrant. There is no right CVA tenderness or left CVA tenderness.     Hernia: There is no hernia in the umbilical area or ventral area.       Comments: There is distinct tenderness to palpation in the left lower quadrant as noted on diagram.   Musculoskeletal:     Cervical back: Neck supple.     Right lower leg: No edema.     Left lower leg: No edema.  Lymphadenopathy:     Cervical: No cervical adenopathy.  Skin:    General: Skin is warm and dry.     Findings: No rash.  Neurological:     Mental Status: She is alert and  oriented to person, place, and time.      UC Treatments / Results  Labs (all labs ordered are listed, but only abnormal results are displayed) Labs Reviewed  POCT URINALYSIS DIP (MANUAL ENTRY) - Abnormal; Notable for the following components:      Result Value   Spec Grav, UA >=1.030 (*)    All other components within normal limits  URINE CULTURE  SARS-COV-2 RNA,(COVID-19) QUALITATIVE NAAT  POCT CBC W AUTO DIFF ([facility]):  WBC 6.4; LY 17.1; MO 11.1; GR 71.8; Hgb 14.9; Platelets 150     EKG   Radiology No results found.  Procedures Procedures (including critical care time)  Medications Ordered in UC Medications - No data to display  Initial Impression / Assessment and Plan / UC Course  I have reviewed the triage vital signs and the nursing notes.  Pertinent labs & imaging results that were available during my care of the patient were reviewed by me and considered in my medical decision making (see chart for details).    Note normal urinalysis today except elevate specific gravity. Normal CBC reassuring.  Review of past records reveals an abdominal CT scan w/o contrast done 09/12/17 that showed incidental finding of diverticulosis. Suspect mild early diverticulitis and will treat accordingly.  Begin Augmentin for five days. As a precaution will check COVID19 PCR.  Will also send urine culture. Followup with Family Doctor in about 4 to 5 days.   Final Clinical Impressions(s) / UC Diagnoses   Final diagnoses:  Dysuria  Chills (without fever)  Abdominal pain, left lower quadrant     Discharge Instructions     Begin clear liquids for about 24 hours, then may begin a BRAT diet (Bananas, Rice, Applesauce, Toast) for about 18 to 24 hours. Then gradually advance to a regular diet as tolerated.  Avoid milk products until well.  May take Tylenol if needed for pain. If symptoms become significantly worse during the night or over the weekend, proceed to the local  emergency room.     ED Prescriptions    Medication Sig Dispense Auth. Provider   amoxicillin-clavulanate (AUGMENTIN) 875-125 MG tablet Take one tab PO Q8hr 15 tablet Lattie Haw, MD        Lattie Haw, MD 06/27/20 1356

## 2020-06-26 NOTE — Discharge Instructions (Addendum)
Begin clear liquids for about 24 hours, then may begin a [employer] (Bananas, Rice, Applesauce, Toast) for about 18 to 24 hours. Then gradually advance to a regular diet as tolerated.  Avoid milk products until well.  May take Tylenol if needed for pain. If symptoms become significantly worse during the night or over the weekend, proceed to the local emergency room.

## 2020-06-28 LAB — URINE CULTURE
MICRO NUMBER:: 11055467
SPECIMEN QUALITY:: ADEQUATE

## 2020-06-28 LAB — SARS-COV-2 RNA,(COVID-19) QUALITATIVE NAAT: SARS CoV2 RNA: NOT DETECTED

## 2020-10-06 DIAGNOSIS — R911 Solitary pulmonary nodule: Secondary | ICD-10-CM | POA: Insufficient documentation

## 2021-04-14 ENCOUNTER — Emergency Department
Admission: RE | Admit: 2021-04-14 | Discharge: 2021-04-14 | Disposition: A | Discharge status: Home / Self Care | Payer: 59 | Source: Ambulatory Visit

## 2021-04-14 ENCOUNTER — Other Ambulatory Visit: Payer: Self-pay

## 2021-04-14 ENCOUNTER — Emergency Department (INDEPENDENT_AMBULATORY_CARE_PROVIDER_SITE_OTHER): Payer: 59

## 2021-04-14 VITALS — BP 122/81 | HR 78 | Temp 98.5°F | Resp 18 | Ht 65.0 in | Wt 197.0 lb

## 2021-04-14 DIAGNOSIS — R1032 Left lower quadrant pain: Secondary | ICD-10-CM | POA: Diagnosis not present

## 2021-04-14 DIAGNOSIS — K5730 Diverticulosis of large intestine without perforation or abscess without bleeding: Secondary | ICD-10-CM

## 2021-04-14 NOTE — ED Provider Notes (Signed)
[name]    CSN: [id number] Arrival date & time: 04/14/21  0850      History   Chief Complaint Chief Complaint  Patient presents with   Abdominal Pain    HPI Fernande Treiber is a 65 y.o. female.   HPI 65 year old female presents with abdominal pain for 3 to 4 months.  Describes left-sided abdominal pain as gnawing and often times feels like a spasm.  Denies constipation.  Reports normal bowel movements, no dysuria, increase in urinary frequency, or hematuria.  She reports PMH of diverticulosis.  Patient reports is unable to get in with PCP for another 2 weeks.  Past Medical History:  Diagnosis Date   Asthma    Hypertension     There are no problems to display for this patient.   History reviewed. No pertinent surgical history.  OB History   No obstetric history on file.      Home Medications    Prior to Admission medications   Medication Sig Start Date End Date Taking? Authorizing Provider  ALPRAZolam Prudy Feeler) 0.25 MG tablet Take 0.25 mg by mouth at bedtime as needed for anxiety.    [provider]  amoxicillin-clavulanate (AUGMENTIN) 875-125 MG tablet Take one tab PO Q8hr 06/26/20   Lattie Haw, MD  esomeprazole (NEXIUM) 40 MG capsule Take one cap PO once daily, 20 minutes AC 03/05/20   Lattie Haw, MD  guaiFENesin (MUCINEX) 600 MG 12 hr tablet Take by mouth.    [provider]  guaiFENesin-codeine 100-10 MG/5ML syrup Take 49mL by mouth at bedtime as needed for cough. 09/18/19   Lattie Haw, MD  hydrochlorothiazide (HYDRODIURIL) 12.5 MG tablet Take 12.5 mg by mouth daily.    [provider]  HYDROcodone-acetaminophen (NORCO/VICODIN) 5-325 MG tablet Take 1-2 tablets by mouth every 4 (four) hours as needed for severe pain. Take with food. 11/21/17   Lajean Manes, MD  montelukast (SINGULAIR) 10 MG tablet Take 10 mg by mouth at bedtime.    [provider]  predniSONE (DELTASONE) 20 MG tablet Take one tab by mouth  twice daily for 4 days, then one daily for 3 days. Take with food. 09/18/19   Lattie Haw, MD  SYMBICORT 160-4.5 MCG/ACT inhaler SMARTSIG:2 Puff(s) By Mouth Twice Daily 12/24/19   [provider]  tiotropium (SPIRIVA) 18 MCG inhalation capsule Place 18 mcg into inhaler and inhale daily.    [provider]    Family History Family History  Problem Relation Age of Onset   Heart failure Mother    Heart failure Father    Cancer Father    Heart failure Brother     Social History Social History   Tobacco Use   Smoking status: Never   Smokeless tobacco: Never  Vaping Use   Vaping Use: Never used  Substance Use Topics   Alcohol use: No   Drug use: No     Allergies   Patient has no known allergies.   Review of Systems Review of Systems  Gastrointestinal:  Positive for abdominal pain.  All other systems reviewed and are negative.   Physical Exam Triage Vital Signs ED Triage Vitals [04/14/21 0911]  Enc Vitals Group     BP      Pulse      Resp      Temp      Temp src      SpO2      Weight 197 lb (89.4 kg)  Height 5\' 5"  (1.651 m)     Head Circumference      Peak Flow      Pain Score 6     Pain Loc      Pain Edu?      Excl. in GC?    No data found.  Updated Vital Signs BP 122/81 (BP Location: Right Arm)   Pulse 78   Temp 98.5 F (36.9 C) (Oral)   Resp 18   Ht 5\' 5"  (1.651 m)   Wt 197 lb (89.4 kg)   SpO2 97%   BMI 32.78 kg/m     Physical Exam Vitals and nursing note reviewed.  Constitutional:      General: She is not in acute distress.    Appearance: She is well-developed. She is obese. She is not ill-appearing.  HENT:     Head: Normocephalic and atraumatic.     Mouth/Throat:     Mouth: Mucous membranes are moist.     Pharynx: Oropharynx is clear.  Eyes:     Extraocular Movements: Extraocular movements intact.     Pupils: Pupils are equal, round, and reactive to light.  Cardiovascular:     Rate and Rhythm: Normal rate and  regular rhythm.     Heart sounds: Murmur heard.  Pulmonary:     Effort: Pulmonary effort is normal.     Breath sounds: Normal breath sounds. No wheezing, rhonchi or rales.  Abdominal:     General: Bowel sounds are normal. There is no distension or abdominal bruit. There are no signs of injury.     Palpations: Abdomen is soft. There is no shifting dullness, fluid wave, hepatomegaly, splenomegaly, mass or pulsatile mass.     Tenderness: There is abdominal tenderness in the left lower quadrant. There is no right CVA tenderness, left CVA tenderness, guarding or rebound. Negative signs include Murphy's sign, Rovsing's sign, McBurney's sign, psoas sign and obturator sign.  Skin:    General: Skin is warm and dry.  Neurological:     General: No focal deficit present.     Mental Status: She is alert and oriented to person, place, and time.  Psychiatric:        Mood and Affect: Mood normal.        Behavior: Behavior normal.     UC Treatments / Results  Labs (all labs ordered are listed, but only abnormal results are displayed) Labs Reviewed - No data to display  EKG   Radiology CT ABDOMEN PELVIS WO CONTRAST  Result Date: 04/14/2021 CLINICAL DATA:  Left lower quadrant pain EXAM: CT ABDOMEN AND PELVIS WITHOUT CONTRAST TECHNIQUE: Multidetector CT imaging of the abdomen and pelvis was performed following the standard protocol without IV contrast. COMPARISON:  None. FINDINGS: Lower chest: No pleural or pericardial effusion. Linear scarring or subsegmental atelectasis in the lung bases. Hepatobiliary: No focal liver abnormality is seen. No gallstones, gallbladder wall thickening, or biliary dilatation. Pancreas: Unremarkable. No pancreatic ductal dilatation or surrounding inflammatory changes. Spleen: Normal in size without focal abnormality. Adrenals/Urinary Tract: Adrenal glands are unremarkable. Kidneys are normal, without renal calculi, focal lesion, or hydronephrosis. Bladder is unremarkable.  Stomach/Bowel: The stomach and small bowel are nondistended. Normal appendix. Scattered colonic diverticula most numerous in the descending and sigmoid segments, without significant adjacent inflammatory change or abscess. Vascular/Lymphatic: Early aortoiliac calcified plaque without aneurysm. No abdominal or pelvic adenopathy. Reproductive: Status post hysterectomy. No adnexal masses. Other: Bilateral pelvic phleboliths.  No ascites.  No free air. Musculoskeletal: Spondylitic changes in  the lower lumbar spine. No fracture or worrisome bone lesion. IMPRESSION: 1. No acute findings. 2. Extensive colonic diverticulosis. 3. Aortic Atherosclerosis (ICD10-170.0). Electronically Signed   By: Corlis Leak M.D.   On: 04/14/2021 10:06    Procedures Procedures (including critical care time)  Medications Ordered in UC Medications - No data to display  Initial Impression / Assessment and Plan / UC Course  I have reviewed the triage vital signs and the nursing notes.  Pertinent labs & imaging results that were available during my care of the patient were reviewed by me and considered in my medical decision making (see chart for details).     MDM: 1.  Left lower quadrant abdominal pain-CT of abdomen without contrast revealed-above (1. no acute findings, 2.  Extensive colonic diverticulosis, 3.  Aortic atherosclerosis). 2.  Diverticulosis of colon without hemorrhage.  Advised patient to increase daily fiber intake, increase daily exercise, and reduce red meat consumption.  Advised patient to follow-up with PCP for further evaluation if abdominal pain persist.  Patient discharged home, hemodynamically stable. Final Clinical Impressions(s) / UC Diagnoses   Final diagnoses:  LLQ abdominal pain  Diverticulosis of colon without hemorrhage     Discharge Instructions      Advised patient to increase daily fiber intake, increase daily exercise, and reduce red meat consumption.  Advised patient to follow-up with  PCP for further evaluation if abdominal pain persist.     ED Prescriptions   None    PDMP not reviewed this encounter.   Trevor Iha, FNP 04/14/21 1030

## 2021-04-14 NOTE — Discharge Instructions (Addendum)
Advised patient to increase daily fiber intake, increase daily exercise, and reduce red meat consumption.  Advised patient to follow-up with PCP for further evaluation if abdominal pain persist.

## 2021-04-14 NOTE — ED Triage Notes (Signed)
Pt presents to Urgent Care with c/o intermittent L-sided abdominal pain x 3-4 months. Describes pain as "gnawing" and oftentimes feels like a spasm. Normal bowel movements and no problems w/ urination. Reports falling and landing on L shoulder and side in May 2022, but states pain was there before this time. States she has diverticulosis.

## 2021-04-16 ENCOUNTER — Telehealth: Payer: Self-pay

## 2021-04-16 NOTE — Telephone Encounter (Signed)
Pt called regarding notes on her scan results. Pt states that it read that she was post status hysterectomy. Pt states that that has to be wrong because she never had a hysterectomy.

## 2021-07-26 DIAGNOSIS — E66812 Obesity, class 2: Secondary | ICD-10-CM | POA: Insufficient documentation

## 2022-05-11 IMAGING — CT CT ABD-PELV W/O CM
2 of 4 series · 16 of 46 positions shown, 18 images · non-contrast
Comparison: None.

CLINICAL DATA: Left lower quadrant pain

EXAM:
CT ABDOMEN AND PELVIS WITHOUT CONTRAST
TECHNIQUE: Multidetector CT imaging of the abdomen and pelvis was performed
following the standard protocol without IV contrast.

[Series 2: axial st · axial · 0.92mm/px · z∈[-478,+2]mm · 13 of 106 slices shown, 15 images]
[im 5/106  soft-tissue]
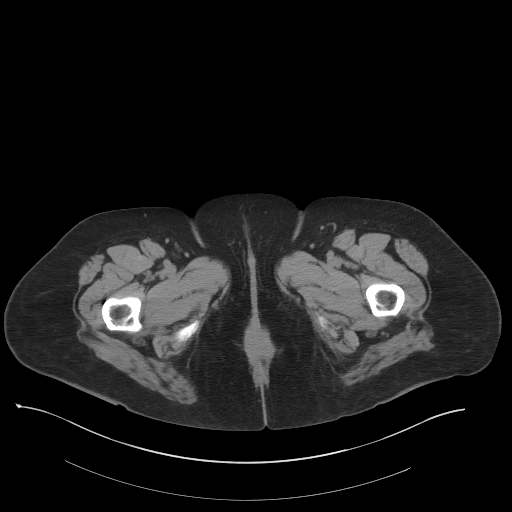
[im 5/106  bone]
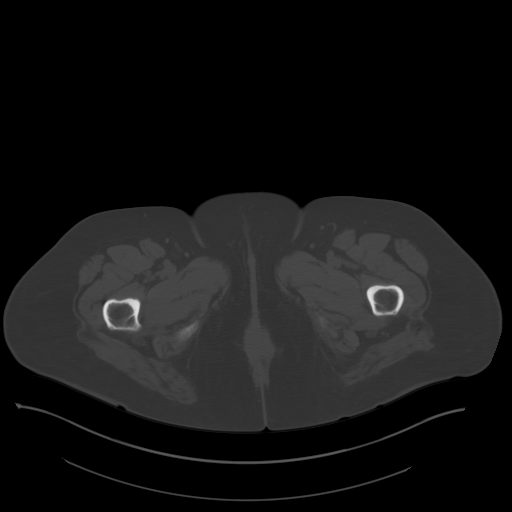
[im 14/106  soft-tissue]
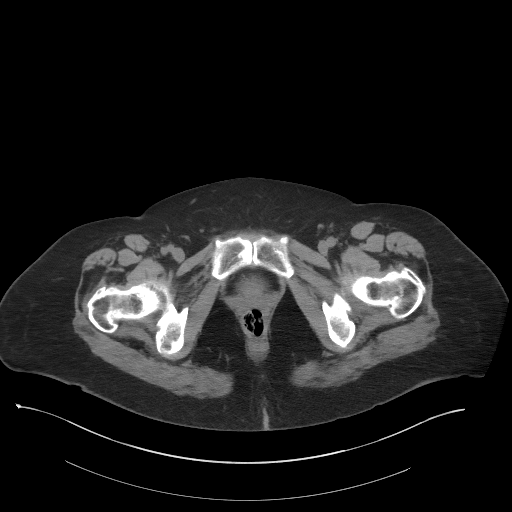
[im 22/106  soft-tissue]
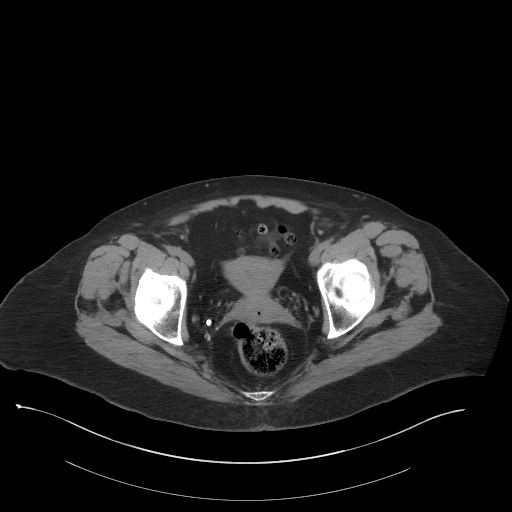
[im 31/106  soft-tissue]
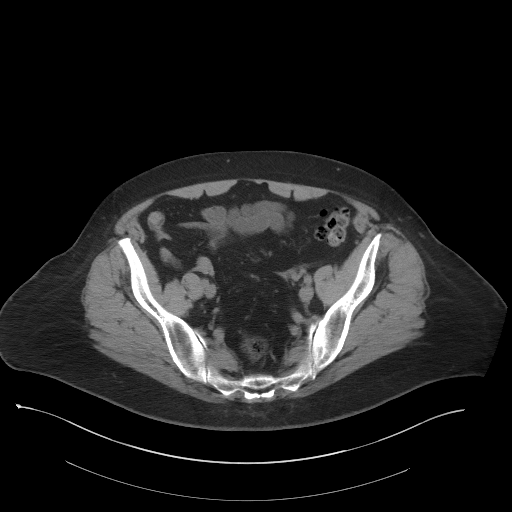
[im 36/106  soft-tissue]
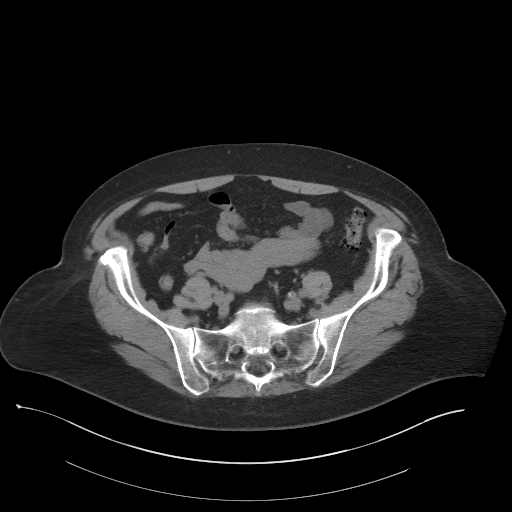
[im 44/106  soft-tissue]
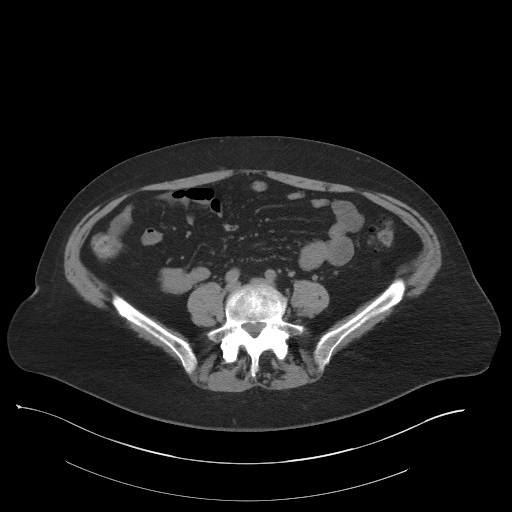
[im 53/106  soft-tissue]
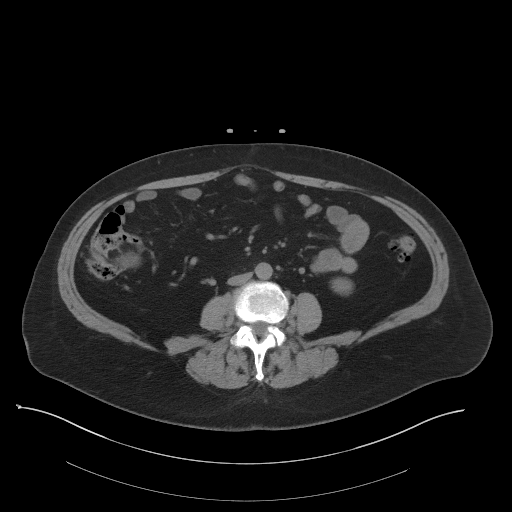
[im 62/106  soft-tissue]
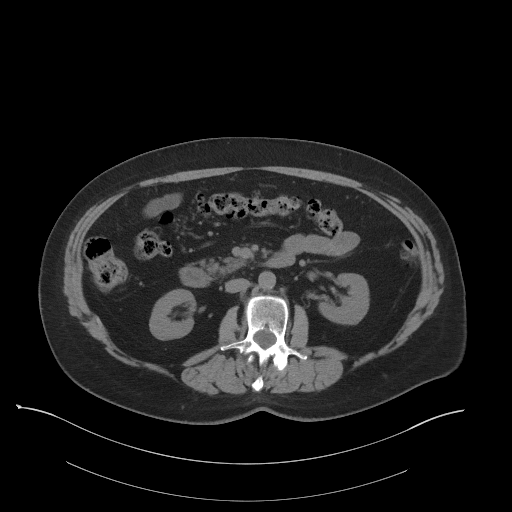
[im 71/106  soft-tissue]
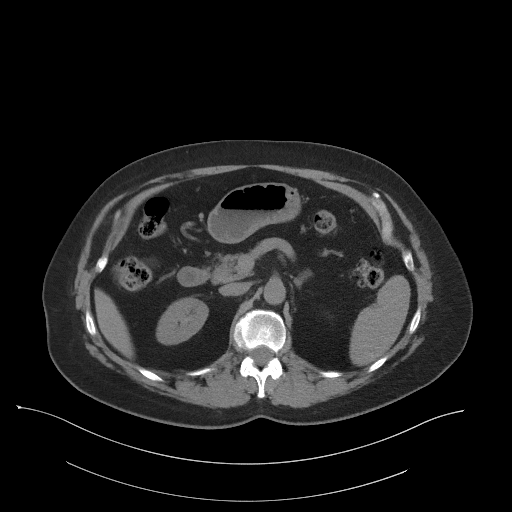
[im 71/106  bone]
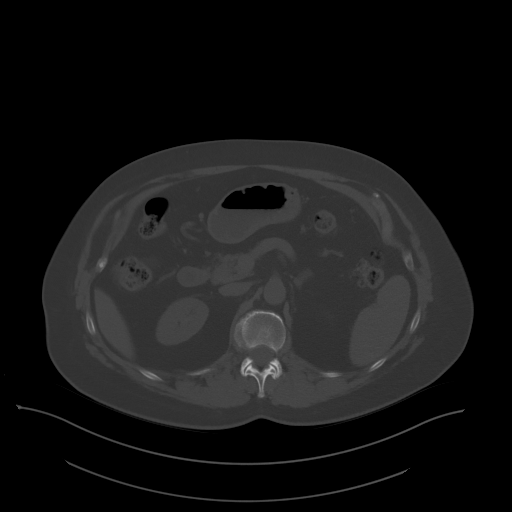
[im 75/106  soft-tissue]
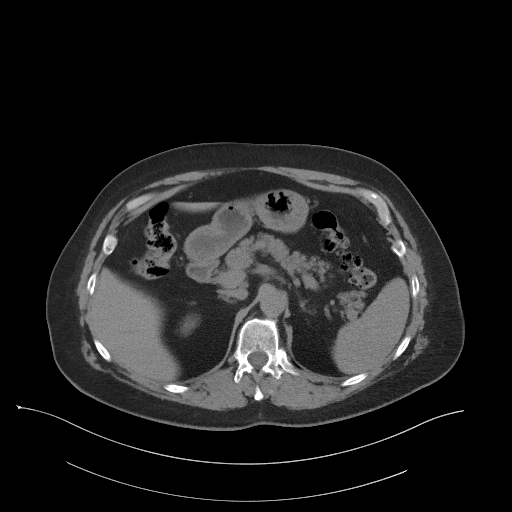
[im 84/106  soft-tissue]
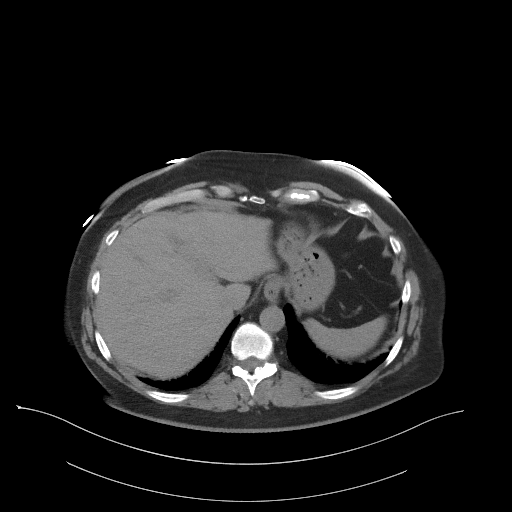
[im 92/106  soft-tissue]
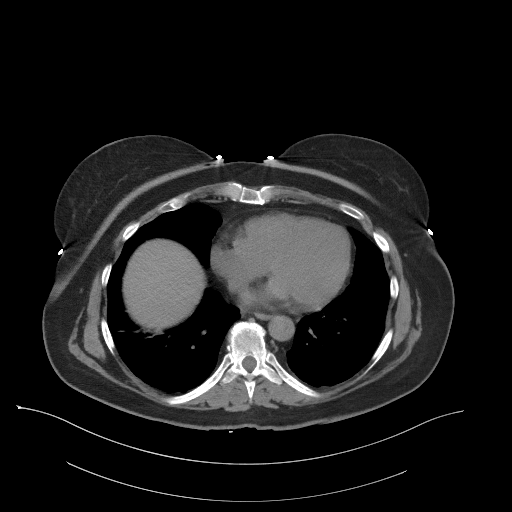
[im 101/106  soft-tissue]
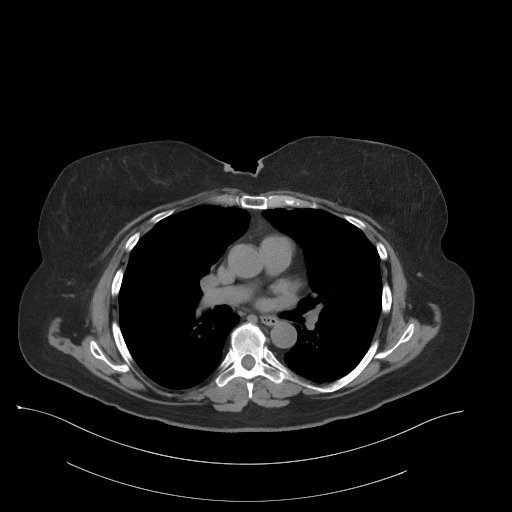

[Series 5: coronal st · coronal · 0.86mm/px · 3 of 101 slices shown]
[im 34/101  soft-tissue]
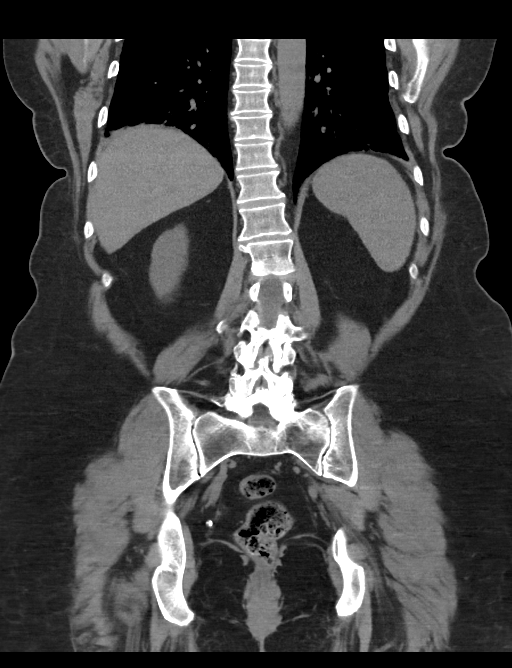
[im 45/101  soft-tissue]
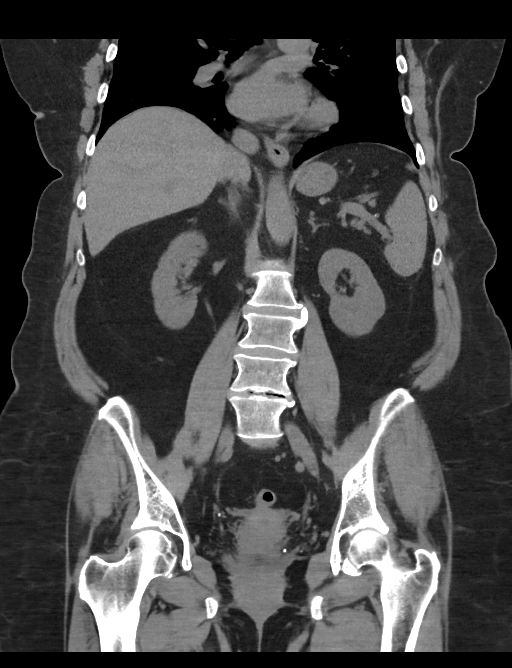
[im 56/101  soft-tissue]
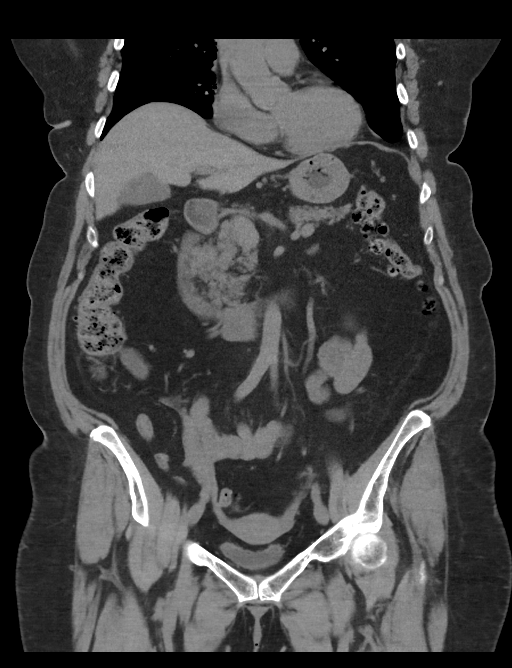

[16 of 46 positions shown; findings below may reference images not displayed]

FINDINGS: Lower chest: No pleural or pericardial effusion. Linear scarring or
subsegmental atelectasis in the lung bases.

Hepatobiliary: No focal liver abnormality is seen. No gallstones,
gallbladder wall thickening, or biliary dilatation.

Pancreas: Unremarkable. No pancreatic ductal dilatation or
surrounding inflammatory changes.

Spleen: Normal in size without focal abnormality.

Adrenals/Urinary Tract: Adrenal glands are unremarkable. Kidneys are
normal, without renal calculi, focal lesion, or hydronephrosis.
Bladder is unremarkable.

Stomach/Bowel: The stomach and small bowel are nondistended. Normal
appendix. Scattered colonic diverticula most numerous in the
descending and sigmoid segments, without significant adjacent
inflammatory change or abscess.

Vascular/Lymphatic: Early aortoiliac calcified plaque without
aneurysm. No abdominal or pelvic adenopathy.

Reproductive: Status post hysterectomy. No adnexal masses.

Other: Bilateral pelvic phleboliths.  No ascites.  No free air.

Musculoskeletal: Spondylitic changes in the lower lumbar spine. No
fracture or worrisome bone lesion.
IMPRESSION: 1. No acute findings.
2. Extensive colonic diverticulosis.
3. Aortic Atherosclerosis ([id number]).

## 2022-06-26 DIAGNOSIS — J385 Laryngeal spasm: Secondary | ICD-10-CM | POA: Insufficient documentation

## 2022-06-26 DIAGNOSIS — R053 Chronic cough: Secondary | ICD-10-CM | POA: Insufficient documentation

## 2023-04-20 ENCOUNTER — Ambulatory Visit (INDEPENDENT_AMBULATORY_CARE_PROVIDER_SITE_OTHER): Payer: 59

## 2023-04-20 ENCOUNTER — Ambulatory Visit
Admission: RE | Admit: 2023-04-20 | Discharge: 2023-04-20 | Disposition: A | Discharge status: Home / Self Care | Payer: 59 | Source: Ambulatory Visit | Attending: Family Medicine | Admitting: Family Medicine

## 2023-04-20 VITALS — BP 135/93 | HR 83 | Temp 98.0°F | Resp 16

## 2023-04-20 DIAGNOSIS — R062 Wheezing: Secondary | ICD-10-CM

## 2023-04-20 DIAGNOSIS — J209 Acute bronchitis, unspecified: Secondary | ICD-10-CM

## 2023-04-20 DIAGNOSIS — J4541 Moderate persistent asthma with (acute) exacerbation: Secondary | ICD-10-CM

## 2023-04-20 DIAGNOSIS — R059 Cough, unspecified: Secondary | ICD-10-CM

## 2023-04-20 MED ORDER — PREDNISONE 20 MG PO TABS: 20.0000 mg | ORAL_TABLET | Freq: Two times a day (BID) | ORAL | 0 refills | Status: DC

## 2023-04-20 MED ORDER — BENZONATATE 200 MG PO CAPS: 200.0000 mg | ORAL_CAPSULE | Freq: Three times a day (TID) | ORAL | 0 refills | Status: DC | PRN

## 2023-04-20 MED ORDER — DOXYCYCLINE HYCLATE 100 MG PO CAPS: 100.0000 mg | ORAL_CAPSULE | Freq: Two times a day (BID) | ORAL | 0 refills | Status: DC

## 2023-04-20 NOTE — ED Provider Notes (Signed)
[name]    CSN: [id number] Arrival date & time: 04/20/23  1359      History   Chief Complaint Chief Complaint  Patient presents with   Cough    Asthma flare up - Entered by patient    HPI Haley Walton is a 67 y.o. female.   HPI  Pleasant 67 year old who is here with cough for 3 weeks.  Has underlying asthma.  Has trouble with recurring bronchitis and pneumonia.  She states after coughing, and using over-the-counter medication, and using her asthma medicine, she continues to cough and now has a productive cough.  She is very tired.  She coughs at night.  Her COVID test at home was negative.  She does not have any fever or chills.  She is coughing up yellow and green sputum.  No hemoptysis.  She is wheezing in spite of her inhalers  Past Medical History:  Diagnosis Date   Asthma    Hypertension     There are no problems to display for this patient.   History reviewed. No pertinent surgical history.  OB History   No obstetric history on file.      Home Medications    Prior to Admission medications   Medication Sig Start Date End Date Taking? Authorizing Provider  albuterol (PROVENTIL) (2.5 MG/3ML) 0.083% nebulizer solution Take 2.5 mg by nebulization. 11/03/18  Yes [provider]  albuterol (VENTOLIN HFA) 108 (90 Base) MCG/ACT inhaler Inhale 1-2 puffs into the lungs every 4 (four) hours as needed. 08/20/22  Yes [provider]  benzonatate (TESSALON) 200 MG capsule Take 1 capsule (200 mg total) by mouth 3 (three) times daily as needed for cough. 04/20/23  Yes Eustace Moore, MD  doxycycline (VIBRAMYCIN) 100 MG capsule Take 1 capsule (100 mg total) by mouth 2 (two) times daily. 04/20/23  Yes Eustace Moore, MD  losartan-hydrochlorothiazide [facility]) 50-12.5 MG tablet Take 1 tablet by mouth daily. 03/22/22  Yes [provider]  predniSONE (DELTASONE) 20 MG tablet Take 1 tablet (20 mg total) by mouth 2 (two) times daily with a meal.  04/20/23  Yes Eustace Moore, MD  ALPRAZolam Prudy Feeler) 0.25 MG tablet Take 0.25 mg by mouth at bedtime as needed for anxiety.    [provider]  esomeprazole (NEXIUM) 40 MG capsule Take one cap PO once daily, 20 minutes AC 03/05/20   Lattie Haw, MD  guaiFENesin (MUCINEX) 600 MG 12 hr tablet Take by mouth.    [provider]  guaiFENesin-codeine 100-10 MG/5ML syrup Take 10mL by mouth at bedtime as needed for cough. 09/18/19   Lattie Haw, MD  montelukast (SINGULAIR) 10 MG tablet Take 10 mg by mouth at bedtime.    [provider]  SYMBICORT 160-4.5 MCG/ACT inhaler SMARTSIG:2 Puff(s) By Mouth Twice Daily 12/24/19   [provider]    Family History Family History  Problem Relation Age of Onset   Heart failure Mother    Heart failure Father    Cancer Father    Heart failure Brother     Social History Social History   Tobacco Use   Smoking status: Never   Smokeless tobacco: Never  Vaping Use   Vaping status: Never Used  Substance Use Topics   Alcohol use: No   Drug use: No     Allergies   Patient has no known allergies.   Review of Systems Review of Systems See HPI  Physical Exam Triage Vital Signs ED Triage  Vitals  Encounter Vitals Group     BP 04/20/23 1410 (!) 135/93     Systolic BP Percentile --      Diastolic BP Percentile --      Pulse Rate 04/20/23 1410 83     Resp 04/20/23 1410 16     Temp 04/20/23 1410 98 F (36.7 C)     Temp src --      SpO2 04/20/23 1410 98 %     Weight --      Height --      Head Circumference --      Peak Flow --      Pain Score 04/20/23 1408 0     Pain Loc --      Pain Education --      Exclude from Growth Chart --    No data found.  Updated Vital Signs BP (!) 135/93   Pulse 83   Temp 98 F (36.7 C)   Resp 16   SpO2 98%      Physical Exam Constitutional:      General: She is not in acute distress.    Appearance: She is well-developed. She is ill-appearing.  HENT:      Head: Normocephalic and atraumatic.     Right Ear: Tympanic membrane and ear canal normal.     Left Ear: Tympanic membrane and ear canal normal.     Nose: No congestion or rhinorrhea.     Mouth/Throat:     Mouth: Mucous membranes are moist.     Pharynx: No posterior oropharyngeal erythema.  Eyes:     Conjunctiva/sclera: Conjunctivae normal.     Pupils: Pupils are equal, round, and reactive to light.  Cardiovascular:     Rate and Rhythm: Normal rate and regular rhythm.     Heart sounds: Normal heart sounds.  Pulmonary:     Effort: Pulmonary effort is normal. No respiratory distress.     Breath sounds: Wheezing and rhonchi present.  Abdominal:     General: There is no distension.     Palpations: Abdomen is soft.  Musculoskeletal:        General: Normal range of motion.     Cervical back: Normal range of motion.  Lymphadenopathy:     Cervical: No cervical adenopathy.  Skin:    General: Skin is warm and dry.  Neurological:     Mental Status: She is alert.      UC Treatments / Results  Labs (all labs ordered are listed, but only abnormal results are displayed) Labs Reviewed - No data to display  EKG   Radiology DG Chest 2 View  Result Date: 04/20/2023 CLINICAL DATA:  Cough and wheeze. EXAM: CHEST - 2 VIEW COMPARISON:  03/05/2020 FINDINGS: The lungs are clear without focal pneumonia, edema, pneumothorax or pleural effusion. Streaky opacity at the left base is stable, compatible with scarring. The cardiopericardial silhouette is within normal limits for size. No acute bony abnormality. IMPRESSION: No active cardiopulmonary disease. Electronically Signed   By: Kennith Center M.D.   On: 04/20/2023 14:42    Procedures Procedures (including critical care time)  Medications Ordered in UC Medications - No data to display  Initial Impression / Assessment and Plan / UC Course  I have reviewed the triage vital signs and the nursing notes.  Pertinent labs & imaging results that  were available during my care of the patient were reviewed by me and considered in my medical decision making (see chart for details).  Concern for abnormal lung sounds.  Worried about pneumonia.  Chest x-ray is clear.  Will treat for acute bronchitis.  Follow-up with PCP Final Clinical Impressions(s) / UC Diagnoses   Final diagnoses:  Acute bronchitis, unspecified organism  Moderate persistent asthma with exacerbation     Discharge Instructions      Take mucinex DM 2 x a day Take the tessalon perles 2-3 x a day Take the antibiotic doxycycline 2 x a day WITH FOOD Take prednisone as directed Continue your inhalers Call for problems     ED Prescriptions     Medication Sig Dispense Auth. Provider   doxycycline (VIBRAMYCIN) 100 MG capsule Take 1 capsule (100 mg total) by mouth 2 (two) times daily. 20 capsule Eustace Moore, MD   predniSONE (DELTASONE) 20 MG tablet Take 1 tablet (20 mg total) by mouth 2 (two) times daily with a meal. 10 tablet Eustace Moore, MD   benzonatate (TESSALON) 200 MG capsule Take 1 capsule (200 mg total) by mouth 3 (three) times daily as needed for cough. 21 capsule Eustace Moore, MD      PDMP not reviewed this encounter.   Eustace Moore, MD 04/20/23 [phone]

## 2023-04-20 NOTE — ED Triage Notes (Addendum)
Pt presents to uc with co of cough for 3 weeks. Pt has been taking musinex and dayquill and nyquill. She has been taking her asthma medications as well with no improvement. Pt reports covid at home neg test

## 2023-04-20 NOTE — Discharge Instructions (Signed)
Take mucinex DM 2 x a day Take the tessalon perles 2-3 x a day Take the antibiotic doxycycline 2 x a day WITH FOOD Take prednisone as directed Continue your inhalers Call for problems

## 2023-05-15 ENCOUNTER — Ambulatory Visit (INDEPENDENT_AMBULATORY_CARE_PROVIDER_SITE_OTHER): Payer: 59

## 2023-05-15 ENCOUNTER — Ambulatory Visit
Admission: RE | Admit: 2023-05-15 | Discharge: 2023-05-15 | Disposition: A | Discharge status: Home / Self Care | Payer: 59 | Source: Ambulatory Visit | Attending: Family Medicine | Admitting: Family Medicine

## 2023-05-15 VITALS — BP 137/81 | HR 85 | Temp 98.6°F | Resp 17

## 2023-05-15 DIAGNOSIS — R059 Cough, unspecified: Secondary | ICD-10-CM | POA: Diagnosis not present

## 2023-05-15 DIAGNOSIS — R053 Chronic cough: Secondary | ICD-10-CM | POA: Diagnosis not present

## 2023-05-15 MED ORDER — BENZONATATE 200 MG PO CAPS: 200.0000 mg | ORAL_CAPSULE | Freq: Two times a day (BID) | ORAL | 0 refills | Status: DC | PRN

## 2023-05-15 NOTE — ED Provider Notes (Signed)
[name]    CSN: [id number] Arrival date & time: 05/15/23  1847      History   Chief Complaint Chief Complaint  Patient presents with   Cough    X 4 wks    HPI Haley Walton is a 67 y.o. female.   I saw Ms. Merkle for coughing a month ago.  At that time she been coughing for 3 weeks.  She tells me she still coughing.  She gets a brief reprieve when she is treated by her physician or when she took the antibiotics and prednisone from me, but basically feels like she has not stopped.  She is on Symbicort, Ventolin, Singulair, Flonase, and Nexium.  All of these medicines to treat her asthma GERD and postnasal drip that might contribute to a chronic cough.  She used to be under the care of a pulmonary doctor but has not gone because she was doing well.  I recommend she go back to her pulmonary doctor at this time.  As I speak with her she has a frequent cough that sounds wet.  She also seems distressed.    Past Medical History:  Diagnosis Date   Asthma    Hypertension     There are no problems to display for this patient.   History reviewed. No pertinent surgical history.  OB History   No obstetric history on file.      Home Medications    Prior to Admission medications   Medication Sig Start Date End Date Taking? Authorizing Provider  benzonatate (TESSALON) 200 MG capsule Take 1 capsule (200 mg total) by mouth 2 (two) times daily as needed for cough. 05/15/23  Yes Eustace Moore, MD  albuterol (PROVENTIL) (2.5 MG/3ML) 0.083% nebulizer solution Take 2.5 mg by nebulization. 11/03/18   [provider]  albuterol (VENTOLIN HFA) 108 (90 Base) MCG/ACT inhaler Inhale 1-2 puffs into the lungs every 4 (four) hours as needed. 08/20/22   [provider]  ALPRAZolam Prudy Feeler) 0.25 MG tablet Take 0.25 mg by mouth at bedtime as needed for anxiety.    [provider]  esomeprazole (NEXIUM) 40 MG capsule Take one cap PO once daily, 20 minutes AC  03/05/20   Lattie Haw, MD  guaiFENesin (MUCINEX) 600 MG 12 hr tablet Take by mouth.    [provider]  guaiFENesin-codeine 100-10 MG/5ML syrup Take 10mL by mouth at bedtime as needed for cough. 09/18/19   Lattie Haw, MD  losartan-hydrochlorothiazide (HYZAAR) 50-12.5 MG tablet Take 1 tablet by mouth daily. 03/22/22   [provider]  montelukast (SINGULAIR) 10 MG tablet Take 10 mg by mouth at bedtime.    [provider]  SYMBICORT 160-4.5 MCG/ACT inhaler SMARTSIG:2 Puff(s) By Mouth Twice Daily 12/24/19   [provider]    Family History Family History  Problem Relation Age of Onset   Heart failure Mother    Heart failure Father    Cancer Father    Heart failure Brother     Social History Social History   Tobacco Use   Smoking status: Never   Smokeless tobacco: Never  Vaping Use   Vaping status: Never Used  Substance Use Topics   Alcohol use: No   Drug use: No     Allergies   Patient has no known allergies.   Review of Systems Review of Systems See HPI  Physical Exam Triage Vital Signs ED Triage Vitals  Encounter Vitals Group     BP 05/15/23  1855 137/81     Systolic BP Percentile --      Diastolic BP Percentile --      Pulse Rate 05/15/23 1855 85     Resp 05/15/23 1855 17     Temp 05/15/23 1855 98.6 F (37 C)     Temp Source 05/15/23 1855 Oral     SpO2 05/15/23 1855 97 %     Weight --      Height --      Head Circumference --      Peak Flow --      Pain Score 05/15/23 1857 0     Pain Loc --      Pain Education --      Exclude from Growth Chart --    No data found.  Updated Vital Signs BP 137/81 (BP Location: Right Arm)   Pulse 85   Temp 98.6 F (37 C) (Oral)   Resp 17   SpO2 97%      Bilateral Near:     Physical Exam Constitutional:      General: She is not in acute distress.    Appearance: She is well-developed.  HENT:     Head: Normocephalic and atraumatic.  Eyes:     Conjunctiva/sclera:  Conjunctivae normal.     Pupils: Pupils are equal, round, and reactive to light.  Cardiovascular:     Rate and Rhythm: Normal rate.  Pulmonary:     Effort: Pulmonary effort is normal. No respiratory distress.     Breath sounds: Rales present. No wheezing.     Comments: Faint rales left base Abdominal:     General: There is no distension.     Palpations: Abdomen is soft.  Musculoskeletal:        General: Normal range of motion.     Cervical back: Normal range of motion.  Skin:    General: Skin is warm and dry.  Neurological:     Mental Status: She is alert.      UC Treatments / Results  Labs (all labs ordered are listed, but only abnormal results are displayed) Labs Reviewed - No data to display  EKG   Radiology DG Chest 2 View  Result Date: 05/15/2023 CLINICAL DATA:  Cough EXAM: CHEST - 2 VIEW COMPARISON:  Chest x-ray 04/20/2023.  Chest x-ray 03/05/2020. FINDINGS: The heart size and mediastinal contours are within normal limits. Focal nodular density in the left suprahilar region appears similar to 2021 measuring 16 mm. No new focal lung infiltrate, pleural effusion or pneumothorax. No acute fractures are seen. The visualized skeletal structures are unremarkable. IMPRESSION: 1. No active cardiopulmonary disease. 2. Focal nodular density in the left suprahilar region appears similar to 2021 and may represent a pulmonary nodule. Electronically Signed   By: Darliss Cheney M.D.   On: 05/15/2023 19:34    Procedures Procedures (including critical care time)  Medications Ordered in UC Medications - No data to display  Initial Impression / Assessment and Plan / UC Course  I have reviewed the triage vital signs and the nursing notes.  Pertinent labs & imaging results that were available during my care of the patient were reviewed by me and considered in my medical decision making (see chart for details).     I did get another chest x-ray because of abnormal lung sounds.  It is  normal.  I feel like she needs to see pulmonary to adjust her asthma medications. Final Clinical Impressions(s) / UC Diagnoses   Final  diagnoses:  Refractory chronic cough     Discharge Instructions      See Dr. Hildred Alamin in pulmonology Continue usual inhalers Take Tessalon plus a DM product like Delsym twice a day Drink lots of fluids   ED Prescriptions     Medication Sig Dispense Auth. Provider   benzonatate (TESSALON) 200 MG capsule Take 1 capsule (200 mg total) by mouth 2 (two) times daily as needed for cough. 60 capsule Eustace Moore, MD      PDMP not reviewed this encounter.   Eustace Moore, MD 05/15/23 [phone]

## 2023-05-15 NOTE — Discharge Instructions (Signed)
See Dr. Hildred Alamin in pulmonology Continue usual inhalers Take Tessalon plus a DM product like Delsym twice a day Drink lots of fluids

## 2023-05-15 NOTE — ED Triage Notes (Signed)
Pt c/o cough x 4 wks . Chills and sweats. Was seen 3 wks ago, tx with abx. Sxs returned 8 days ago. Saw PCP ,tx with steroids.

## 2023-10-15 ENCOUNTER — Ambulatory Visit (INDEPENDENT_AMBULATORY_CARE_PROVIDER_SITE_OTHER): Payer: No Typology Code available for payment source

## 2023-10-15 ENCOUNTER — Ambulatory Visit
Admission: EM | Admit: 2023-10-15 | Discharge: 2023-10-15 | Disposition: A | Discharge status: Home / Self Care | Payer: No Typology Code available for payment source | Attending: Family Medicine | Admitting: Family Medicine

## 2023-10-15 ENCOUNTER — Other Ambulatory Visit: Payer: Self-pay

## 2023-10-15 DIAGNOSIS — R911 Solitary pulmonary nodule: Secondary | ICD-10-CM | POA: Diagnosis not present

## 2023-10-15 DIAGNOSIS — R051 Acute cough: Secondary | ICD-10-CM | POA: Diagnosis not present

## 2023-10-15 DIAGNOSIS — R058 Other specified cough: Secondary | ICD-10-CM | POA: Diagnosis not present

## 2023-10-15 DIAGNOSIS — J019 Acute sinusitis, unspecified: Secondary | ICD-10-CM

## 2023-10-15 DIAGNOSIS — J4521 Mild intermittent asthma with (acute) exacerbation: Secondary | ICD-10-CM | POA: Diagnosis not present

## 2023-10-15 LAB — POC COVID19/FLU A&B COMBO
Covid Antigen, POC: NEGATIVE
Influenza A Antigen, POC: NEGATIVE
Influenza B Antigen, POC: NEGATIVE

## 2023-10-15 MED ORDER — BENZONATATE 100 MG PO CAPS: 100.0000 mg | ORAL_CAPSULE | Freq: Three times a day (TID) | ORAL | 0 refills | Status: AC | PRN

## 2023-10-15 MED ORDER — PREDNISONE 20 MG PO TABS: 40.0000 mg | ORAL_TABLET | Freq: Every day | ORAL | 0 refills | Status: AC

## 2023-10-15 MED ORDER — DOXYCYCLINE HYCLATE 100 MG PO CAPS: 100.0000 mg | ORAL_CAPSULE | Freq: Two times a day (BID) | ORAL | 0 refills | Status: AC

## 2023-10-15 NOTE — ED Provider Notes (Signed)
[facility]    CSN: [id number] Arrival date & time: 10/15/23  1307      History   Chief Complaint Chief Complaint  Patient presents with   Cough    HPI Haley Walton is a 68 y.o. female.    Cough Here for cough and congestion.  Symptoms began on January 21 or 22.  She states she often has a cough due to her asthma, but she had more mucus production by January 22.  She has had subjective fever at home.  She possibly felt a little better on January 24th or 25th and then on January 26 she started feeling worse again with increased congestion and some headache and sinus pressure.  She last had a nebulizer treatment about 4 hours ago.  She has not had anything for pain or fever today, and her temperature is normal at 98.4.  Her husband has been in the bed with fever and similar symptoms otherwise since January 25.  He has not had any testing  NKDA  Past Medical History:  Diagnosis Date   Asthma    Hypertension     There are no active problems to display for this patient.   History reviewed. No pertinent surgical history.  OB History   No obstetric history on file.      Home Medications    Prior to Admission medications   Medication Sig Start Date End Date Taking? Authorizing Provider  Azelastine HCl 137 MCG/SPRAY SOLN SPRAY 1 SPRAY IN EACH NOSTRIL TWICE A DAY AS DIRECTED 12/13/20  Yes [provider]  benzonatate (TESSALON) 100 MG capsule Take 1 capsule (100 mg total) by mouth 3 (three) times daily as needed for cough. 10/15/23  Yes Haley Resides, MD  cetirizine (ZYRTEC) 10 MG tablet Take by mouth. 01/09/22  Yes [provider]  doxycycline (VIBRAMYCIN) 100 MG capsule Take 1 capsule (100 mg total) by mouth 2 (two) times daily for 7 days. 10/15/23 10/22/23 Yes Haley Resides, MD  predniSONE (DELTASONE) 20 MG tablet Take 2 tablets (40 mg total) by mouth daily with breakfast for 5 days. 10/15/23 10/20/23 Yes Haley Resides, MD  albuterol  (PROVENTIL) (2.5 MG/3ML) 0.083% nebulizer solution Take 2.5 mg by nebulization. 11/03/18   [provider]  albuterol (VENTOLIN HFA) 108 (90 Base) MCG/ACT inhaler Inhale 1-2 puffs into the lungs every 4 (four) hours as needed. 08/20/22   [provider]  ALPRAZolam Prudy Feeler) 0.25 MG tablet Take 0.25 mg by mouth at bedtime as needed for anxiety.    [provider]  esomeprazole (NEXIUM) 40 MG capsule Take one cap PO once daily, 20 minutes AC 03/05/20   Haley Haw, MD  guaiFENesin (MUCINEX) 600 MG 12 hr tablet Take by mouth.    [provider]  guaiFENesin-codeine 100-10 MG/5ML syrup Take 10mL by mouth at bedtime as needed for cough. 09/18/19   Haley Haw, MD  hydrochlorothiazide (HYDRODIURIL) 12.5 MG tablet Take by mouth.    [provider]  losartan-hydrochlorothiazide (HYZAAR) 50-12.5 MG tablet Take 1 tablet by mouth daily. 03/22/22   [provider]  montelukast (SINGULAIR) 10 MG tablet Take 10 mg by mouth at bedtime.    [provider]  SYMBICORT 160-4.5 MCG/ACT inhaler SMARTSIG:2 Puff(s) By Mouth Twice Daily 12/24/19   [provider]    Family History Family History  Problem Relation Age of Onset   Heart failure Mother    Heart failure Father    Cancer Father  Heart failure Brother     Social History Social History   Tobacco Use   Smoking status: Never   Smokeless tobacco: Never  Vaping Use   Vaping status: Never Used  Substance Use Topics   Alcohol use: No   Drug use: No     Allergies   Patient has no known allergies.   Review of Systems Review of Systems  Respiratory:  Positive for cough.      Physical Exam Triage Vital Signs ED Triage Vitals  Encounter Vitals Group     BP 10/15/23 1412 120/77     Systolic BP Percentile --      Diastolic BP Percentile --      Pulse Rate 10/15/23 1412 76     Resp 10/15/23 1412 16     Temp 10/15/23 1412 98.4 F (36.9 C)     Temp src --      SpO2  10/15/23 1412 98 %     Weight --      Height --      Head Circumference --      Peak Flow --      Pain Score 10/15/23 1410 0     Pain Loc --      Pain Education --      Exclude from Growth Chart --    No data found.  Updated Vital Signs BP 120/77   Pulse 76   Temp 98.4 F (36.9 C)   Resp 16   SpO2 98%   Visual Acuity Right Eye Distance:   Left Eye Distance:   Bilateral Distance:    Right Eye Near:   Left Eye Near:    Bilateral Near:     Physical Exam Vitals reviewed.  Constitutional:      General: She is not in acute distress.    Appearance: She is not ill-appearing, toxic-appearing or diaphoretic.  HENT:     Right Ear: Tympanic membrane and ear canal normal.     Left Ear: Tympanic membrane and ear canal normal.     Nose: Nose normal.     Mouth/Throat:     Mouth: Mucous membranes are moist.     Comments: No erythema; there is clear exudate Eyes:     Extraocular Movements: Extraocular movements intact.     Conjunctiva/sclera: Conjunctivae normal.     Pupils: Pupils are equal, round, and reactive to light.  Cardiovascular:     Rate and Rhythm: Normal rate and regular rhythm.     Heart sounds: No murmur heard. Pulmonary:     Effort: No respiratory distress.     Breath sounds: No stridor. No rhonchi or rales.     Comments: No wheezing heard on lung exam, but she has a tight wheezy cough while being examined. Chest:     Chest wall: No tenderness.  Musculoskeletal:     Cervical back: Neck supple.  Lymphadenopathy:     Cervical: No cervical adenopathy.  Skin:    Capillary Refill: Capillary refill takes less than 2 seconds.     Coloration: Skin is not jaundiced or pale.  Neurological:     General: No focal deficit present.     Mental Status: She is alert and oriented to person, place, and time.  Psychiatric:        Behavior: Behavior normal.      UC Treatments / Results  Labs (all labs ordered are listed, but only abnormal results are displayed) Labs  Reviewed  POC COVID19/FLU A&B COMBO  EKG   Radiology DG Chest 2 View Result Date: 10/15/2023 CLINICAL DATA:  Cough with sputum EXAM: CHEST - 2 VIEW COMPARISON:  05/15/2023 FINDINGS: Stable linear scarring in the left lower lobe. 2.1 cm nodule centrally in the left upper lobe, previously 18 mm on 03/05/2020. Lungs otherwise clear. Heart size and mediastinal contours are within normal limits. No effusion. Visualized bones unremarkable. IMPRESSION: 1. No acute findings. 2. 2.1 cm left upper lobe nodule, previously 18 mm on 03/05/2020, consistent with indolent process. Electronically Signed   By: Corlis Leak M.D.   On: 10/15/2023 14:57    Procedures Procedures (including critical care time)  Medications Ordered in UC Medications - No data to display  Initial Impression / Assessment and Plan / UC Course  I have reviewed the triage vital signs and the nursing notes.  Pertinent labs & imaging results that were available during my care of the patient were reviewed by me and considered in my medical decision making (see chart for details).     Chest x-ray is clear.  There is a nodule that is fairly stable from previous.  Flu and COVID swab is negative, done because she had had some worsening in the last 2 days  Prednisone is sent in for apparent asthma exacerbation and doxycycline is sent in for possible sinus infection. Final Clinical Impressions(s) / UC Diagnoses   Final diagnoses:  Acute cough  Mild intermittent asthma with exacerbation  Acute sinusitis, recurrence not specified, unspecified location     Discharge Instructions      Your flu and COVID test was negative   your chest x-ray did not show any pneumonia.  There is a nodule that has been on your chest x-rays for the last few years.  Please just follow-up with your primary care about that.  Take prednisone 20 mg--2 daily for 5 days  Take benzonatate 100 mg, 1 tab every 8 hours as needed for cough.  Take  doxycycline 100 mg --1 capsule 2 times daily for 7 days; this is an antibiotic for possible sinus infection         ED Prescriptions     Medication Sig Dispense Auth. Provider   predniSONE (DELTASONE) 20 MG tablet Take 2 tablets (40 mg total) by mouth daily with breakfast for 5 days. 10 tablet Haley Resides, MD   benzonatate (TESSALON) 100 MG capsule Take 1 capsule (100 mg total) by mouth 3 (three) times daily as needed for cough. 21 capsule Haley Resides, MD   doxycycline (VIBRAMYCIN) 100 MG capsule Take 1 capsule (100 mg total) by mouth 2 (two) times daily for 7 days. 14 capsule Marlinda Mike, Janace Aris, MD      PDMP not reviewed this encounter.   Haley Resides, MD 10/15/23 [phone]

## 2023-10-15 NOTE — ED Triage Notes (Addendum)
Pt presents to uc with co of cough since last Tuesday. Pt reports husband has had the flu. Pt endorses otalgia and sneezing. Pt is taking left over doxycycline she had from December

## 2023-10-15 NOTE — Discharge Instructions (Signed)
Your flu and COVID test was negative   your chest x-ray did not show any pneumonia.  There is a nodule that has been on your chest x-rays for the last few years.  Please just follow-up with your primary care about that.  Take prednisone 20 mg--2 daily for 5 days  Take benzonatate 100 mg, 1 tab every 8 hours as needed for cough.  Take doxycycline 100 mg --1 capsule 2 times daily for 7 days; this is an antibiotic for possible sinus infection

## 2024-10-09 ENCOUNTER — Telehealth: Payer: Self-pay

## 2024-10-09 ENCOUNTER — Ambulatory Visit
Admission: RE | Admit: 2024-10-09 | Discharge: 2024-10-09 | Disposition: A | Discharge status: Home / Self Care | Attending: Family Medicine | Admitting: Family Medicine

## 2024-10-09 VITALS — BP 133/86 | HR 76 | Temp 98.2°F | Resp 18

## 2024-10-09 DIAGNOSIS — J0100 Acute maxillary sinusitis, unspecified: Secondary | ICD-10-CM | POA: Diagnosis not present

## 2024-10-09 DIAGNOSIS — R0981 Nasal congestion: Secondary | ICD-10-CM

## 2024-10-09 MED ORDER — PREDNISONE 20 MG PO TABS: ORAL_TABLET | ORAL | 0 refills | Status: AC

## 2024-10-09 MED ORDER — AMOXICILLIN-POT CLAVULANATE 875-125 MG PO TABS: 1.0000 | ORAL_TABLET | Freq: Two times a day (BID) | ORAL | 0 refills | Status: AC

## 2024-10-09 NOTE — ED Provider Notes (Signed)
 " [facility]    CSN: [id number] Arrival date & time: 10/09/24  1511      History   Chief Complaint Chief Complaint  Patient presents with   Headache    Sore throat and ear. For 4 days - Entered by patient   Otalgia    HPI Hermela Hardt is a 69 y.o. female.   HPI Pleasant 68 year old female presents with headache sore throat and ear pain for 4 days.  PMH significant for HTN, chronic cough, and laryngeal spasms.  Past Medical History:  Diagnosis Date   Asthma    Hypertension     Patient Active Problem List   Diagnosis Date Noted   Chronic cough 06/26/2022   Laryngospasms 06/26/2022   Class 2 severe obesity due to excess calories with serious comorbidity and body mass index (BMI) of 35.0 to 35.9 in adult 07/26/2021   Lung nodule 10/06/2020   Severe persistent allergic asthma (HCC) 06/09/2020   GERD (gastroesophageal reflux disease) 05/29/2019   Adenomatous polyp of ascending colon 01/17/2017   Chest pain, unspecified 11/08/2015   Dyslipidemia 11/08/2015   Abnormal LFTs 10/05/2015   Adjustment disorder with mixed anxiety and depressed mood 10/05/2015   Vitamin D deficiency 10/05/2015   Asthma 08/05/2015   Benign essential HTN 08/05/2015   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 Authorizing Provider  albuterol (PROVENTIL) (2.5 MG/3ML) 0.083% nebulizer solution Take 2.5 mg by nebulization. 11/03/18  Yes [provider]  albuterol (VENTOLIN HFA) 108 (90 Base) MCG/ACT inhaler Inhale 1-2 puffs  into the lungs every 4 (four) hours as needed. 08/20/22  Yes [provider]  ALPRAZolam (XANAX) 0.25 MG tablet Take 0.25 mg by mouth at bedtime as needed for anxiety.   Yes [provider]  amoxicillin -clavulanate (AUGMENTIN ) 875-125 MG tablet Take 1 tablet by mouth every 12 (twelve) hours. 10/09/24  Yes Teddy Sharper, FNP  cetirizine (ZYRTEC) 10 MG tablet Take by mouth. Patient taking differently: Take by mouth daily as needed. 01/09/22  Yes [provider]  EPINEPHrine 0.3 mg/0.3 mL IJ SOAJ injection INJECT 0.3MLS INTO THE SKIN AS NEEDED FOR ANAPHYLAXIS,SEE PACKAGE FOR INSTRUCTIONS Patient taking differently: daily as needed. 03/02/15  Yes [provider]  guaiFENesin  (MUCINEX ) 600 MG 12 hr tablet Take by mouth.   Yes [provider]  hydrochlorothiazide (HYDRODIURIL) 12.5 MG tablet Take by mouth.   Yes [provider]  ipratropium-albuterol (DUONEB) 0.5-2.5 (3) MG/3ML SOLN Inhale 3 mLs into the lungs. 10/14/21  Yes [provider]  losartan-hydrochlorothiazide (HYZAAR) 50-12.5 MG tablet Take 1 tablet by mouth daily. 03/22/22  Yes [provider]  montelukast (SINGULAIR) 10 MG tablet Take 10 mg by mouth at bedtime.   Yes [provider]  Multiple Vitamin (  MULTIVITAMIN PO) Take by mouth.   Yes [provider]  predniSONE  (DELTASONE ) 20 MG tablet Take 3 tabs PO daily x 5 days. 10/09/24  Yes Teddy Sharper, FNP  ALBUTEROL IN by Other route. Patient not taking: Reported on 10/09/2024    [provider]  Azelastine HCl 137 MCG/SPRAY SOLN SPRAY 1 SPRAY IN EACH NOSTRIL TWICE A DAY AS DIRECTED Patient not taking: Reported on 10/09/2024 12/13/20   [provider]  BECLOMETHASONE DIPROPIONATE IN Inhale into the lungs.    [provider]  benzonatate  (TESSALON ) 100 MG capsule Take 1 capsule (100 mg total) by mouth 3 (three) times daily as needed for cough. Patient not taking: Reported on 10/09/2024  10/15/23   Banister, Pamela K, MD  Calcium Carb-Cholecalciferol (OYSCO 500 + D) 500-5 MG-MCG TABS Take 1 tablet by mouth. Patient not taking: Reported on 10/09/2024    [provider]  esomeprazole  (NEXIUM ) 40 MG capsule Take one cap PO once daily, 20 minutes AC Patient not taking: Reported on 10/09/2024 03/05/20   Pauline Garnette LABOR, MD  guaiFENesin -codeine  100-10 MG/5ML syrup Take 10mL by mouth at bedtime as needed for cough. Patient not taking: Reported on 10/09/2024 09/18/19   Pauline Garnette LABOR, MD  HYDROcodone  bit-homatropine (HYCODAN) 5-1.5 MG/5ML syrup Take 5 mLs by mouth. Patient not taking: Reported on 10/09/2024 08/18/17   [provider]  promethazine-dextromethorphan (PROMETHAZINE-DM) 6.25-15 MG/5ML syrup TAKE 5 MLS BY MOUTH 4 TIMES A DAY AS NEEDED FOR COUGH Patient not taking: Reported on 10/09/2024 07/13/22   [provider]  RANITIDINE HCL PO Take by mouth. Patient not taking: Reported on 10/09/2024    [provider]  SYMBICORT 160-4.5 MCG/ACT inhaler SMARTSIG:2 Puff(s) By Mouth Twice Daily Patient not taking: Reported on 10/09/2024 12/24/19   [provider]    Family History Family History  Problem Relation Age of Onset   Heart failure Mother    Heart failure Father    Cancer Father    Heart failure Brother     Social History Social History[1]   Allergies   Bee venom, Flour, Milk protein, Pork allergy, Sugar-protein-starch, and Tilactase   Review of Systems Review of Systems  HENT:  Positive for congestion, sinus pressure and sinus pain.   All other systems reviewed and are negative.    Physical Exam Triage Vital Signs ED Triage Vitals [10/09/24 1548]  Encounter Vitals Group     BP      Girls Systolic BP Percentile      Girls Diastolic BP Percentile      Boys Systolic BP Percentile      Boys Diastolic BP Percentile      Pulse      Resp      Temp      Temp src      SpO2      Weight      Height      Head  Circumference      Peak Flow      Pain Score 5     Pain Loc      Pain Education      Exclude from Growth Chart    No data found.  Updated Vital Signs BP 133/86 (BP Location: Right Arm)   Pulse 76   Temp 98.2 F (36.8 C) (Oral)   Resp 18   SpO2 98%   Visual Acuity Right Eye Distance:   Left Eye Distance:   Bilateral Distance:    Right Eye Near:  Left Eye Near:    Bilateral Near:     Physical Exam Vitals and nursing note reviewed.  Constitutional:      Appearance: Normal appearance. She is obese. She is ill-appearing.  HENT:     Head: Normocephalic and atraumatic.     Right Ear: Tympanic membrane and external ear normal.     Left Ear: Tympanic membrane and external ear normal.     Ears:     Comments: Moderate eustachian tube dysfunction noted bilaterally    Mouth/Throat:     Mouth: Mucous membranes are moist.     Pharynx: Oropharynx is clear.     Comments: Mild amount of clear drainage of posterior oropharynx noted Eyes:     Extraocular Movements: Extraocular movements intact.     Conjunctiva/sclera: Conjunctivae normal.     Pupils: Pupils are equal, round, and reactive to light.  Cardiovascular:     Rate and Rhythm: Normal rate and regular rhythm.     Heart sounds: Normal heart sounds.  Pulmonary:     Effort: Pulmonary effort is normal.     Breath sounds: Normal breath sounds. No wheezing, rhonchi or rales.  Musculoskeletal:        General: Normal range of motion.     Cervical back: Normal range of motion and neck supple.  Skin:    General: Skin is warm and dry.  Neurological:     General: No focal deficit present.     Mental Status: She is alert and oriented to person, place, and time.  Psychiatric:        Mood and Affect: Mood normal.        Behavior: Behavior normal.      UC Treatments / Results  Labs (all labs ordered are listed, but only abnormal results are displayed) Labs Reviewed - No data to display  EKG   Radiology No results  found.  Procedures Procedures (including critical care time)  Medications Ordered in UC Medications - No data to display  Initial Impression / Assessment and Plan / UC Course  I have reviewed the triage vital signs and the nursing notes.  Pertinent labs & imaging results that were available during my care of the patient were reviewed by me and considered in my medical decision making (see chart for details).     MDM: 1.  Acute nonrecurrent maxillary sinusitis-Rx'd Augmentin  875/125 mg tablet: Take 1 tablet twice daily x 7 days; 2.  Nasal sinus congestion-Rx' prednisone  20 mg tablet: Take 3 tablets p.o. daily x 5 days. Advised patient take medications as directed with food to completion.  Advised take prednisone  with first dose of Augmentin  for the next 5 of 7 days.  Encouraged increase daily water intake to 64 ounces per day while taking these medications.  Advised if symptoms worsen and are unresolved please follow-up your PCP or here for further evaluation.  Patient discharged home, hemodynamically stable. Final Clinical Impressions(s) / UC Diagnoses   Final diagnoses:  Acute non-recurrent maxillary sinusitis  Nasal sinus congestion     Discharge Instructions      Advised patient take medications as directed with food to completion.  Advised take prednisone  with first dose of Augmentin  for the next 5 of 7 days.  Encouraged increase daily water intake to 64 ounces per day while taking these medications.  Advised if symptoms worsen and are unresolved please follow-up your PCP or here for further evaluation.     ED Prescriptions     Medication Sig Dispense  Auth. Provider   amoxicillin -clavulanate (AUGMENTIN ) 875-125 MG tablet Take 1 tablet by mouth every 12 (twelve) hours. 14 tablet Lyndzie Zentz, FNP   predniSONE  (DELTASONE ) 20 MG tablet Take 3 tabs PO daily x 5 days. 15 tablet Rohit Deloria, FNP      PDMP not reviewed this encounter.    [1]  Social History Tobacco Use    Smoking status: Never    Passive exposure: Never   Smokeless tobacco: Never  Vaping Use   Vaping status: Never Used  Substance Use Topics   Alcohol use: No   Drug use: No     Teddy Sharper, FNP 10/09/24 1651  "

## 2024-10-09 NOTE — Telephone Encounter (Signed)
 Pharmacy was called due to the patient claiming she was having an issue picking up her medication.  The patient's identify was confirmed with two patient identifiers. The pharmacy staff stated they were backed up with filling medications and would be filling the medication.

## 2024-10-09 NOTE — Telephone Encounter (Signed)
 The patient was called back due to her calling with a pharmacy issue. The patient's identify was confirmed with two patient identifiers. The patient was informed the pharmacy said they were back up and would be filling her medication. Patient verbalized understanding.

## 2024-10-09 NOTE — ED Triage Notes (Signed)
 Monday/Tuesday she started having a headache that increases at night causing nausea. Since then she has had a right sided sore throat and internal/external right ear pain. She has tried Tylenol , Ibuprofen, and Vitamin C, elderberry all without relief.

## 2024-10-09 NOTE — Discharge Instructions (Addendum)
 Advised patient take medications as directed with food to completion.  Advised take prednisone  with first dose of Augmentin  for the next 5 of 7 days.  Encouraged increase daily water intake to 64 ounces per day while taking these medications.  Advised if symptoms worsen and are unresolved please follow-up your PCP or here for further evaluation.
